# Patient Record
Sex: Female | Born: 1940 | Race: White | Hispanic: No | Smoking: Never smoker
Health system: Southern US, Community
[De-identification: ages and names within clinical notes are randomized; demographics above are authoritative.]

## PROBLEM LIST (undated history)

## (undated) ENCOUNTER — Ambulatory Visit: Payer: Medicare PPO

## (undated) ENCOUNTER — Ambulatory Visit: Admission: EM | Payer: Medicare PPO | Source: Home / Self Care

## (undated) DIAGNOSIS — E119 Type 2 diabetes mellitus without complications: Secondary | ICD-10-CM

## (undated) DIAGNOSIS — I10 Essential (primary) hypertension: Secondary | ICD-10-CM

## (undated) DIAGNOSIS — N289 Disorder of kidney and ureter, unspecified: Secondary | ICD-10-CM

## (undated) DIAGNOSIS — N189 Chronic kidney disease, unspecified: Secondary | ICD-10-CM

## (undated) HISTORY — PX: CARDIAC SURGERY: SHX584

---

## 2020-08-28 ENCOUNTER — Observation Stay (HOSPITAL_COMMUNITY): Payer: Medicare PPO

## 2020-08-28 ENCOUNTER — Encounter (HOSPITAL_COMMUNITY): Payer: Self-pay | Admitting: Obstetrics and Gynecology

## 2020-08-28 ENCOUNTER — Emergency Department (HOSPITAL_COMMUNITY): Payer: Medicare PPO

## 2020-08-28 ENCOUNTER — Inpatient Hospital Stay (HOSPITAL_COMMUNITY)
Admission: EM | Admit: 2020-08-28 | Discharge: 2020-09-01 | DRG: 291 | Disposition: A | Discharge status: Home Health | Payer: Medicare PPO | Attending: Internal Medicine | Admitting: Internal Medicine

## 2020-08-28 ENCOUNTER — Other Ambulatory Visit: Payer: Self-pay | Admitting: Internal Medicine

## 2020-08-28 ENCOUNTER — Other Ambulatory Visit: Payer: Self-pay

## 2020-08-28 DIAGNOSIS — R54 Age-related physical debility: Secondary | ICD-10-CM | POA: Diagnosis present

## 2020-08-28 DIAGNOSIS — Z951 Presence of aortocoronary bypass graft: Secondary | ICD-10-CM

## 2020-08-28 DIAGNOSIS — R0603 Acute respiratory distress: Secondary | ICD-10-CM | POA: Diagnosis present

## 2020-08-28 DIAGNOSIS — I509 Heart failure, unspecified: Secondary | ICD-10-CM

## 2020-08-28 DIAGNOSIS — Z79899 Other long term (current) drug therapy: Secondary | ICD-10-CM

## 2020-08-28 DIAGNOSIS — Z9114 Patient's other noncompliance with medication regimen: Secondary | ICD-10-CM

## 2020-08-28 DIAGNOSIS — Z833 Family history of diabetes mellitus: Secondary | ICD-10-CM

## 2020-08-28 DIAGNOSIS — K449 Diaphragmatic hernia without obstruction or gangrene: Secondary | ICD-10-CM | POA: Diagnosis present

## 2020-08-28 DIAGNOSIS — R251 Tremor, unspecified: Secondary | ICD-10-CM | POA: Diagnosis present

## 2020-08-28 DIAGNOSIS — Z20822 Contact with and (suspected) exposure to covid-19: Secondary | ICD-10-CM | POA: Diagnosis present

## 2020-08-28 DIAGNOSIS — I5043 Acute on chronic combined systolic (congestive) and diastolic (congestive) heart failure: Secondary | ICD-10-CM | POA: Diagnosis not present

## 2020-08-28 DIAGNOSIS — N1832 Chronic kidney disease, stage 3b: Secondary | ICD-10-CM | POA: Diagnosis present

## 2020-08-28 DIAGNOSIS — R1314 Dysphagia, pharyngoesophageal phase: Secondary | ICD-10-CM | POA: Diagnosis present

## 2020-08-28 DIAGNOSIS — I255 Ischemic cardiomyopathy: Secondary | ICD-10-CM | POA: Diagnosis present

## 2020-08-28 DIAGNOSIS — E785 Hyperlipidemia, unspecified: Secondary | ICD-10-CM | POA: Diagnosis present

## 2020-08-28 DIAGNOSIS — I420 Dilated cardiomyopathy: Secondary | ICD-10-CM | POA: Diagnosis present

## 2020-08-28 DIAGNOSIS — E039 Hypothyroidism, unspecified: Secondary | ICD-10-CM | POA: Diagnosis present

## 2020-08-28 DIAGNOSIS — I313 Pericardial effusion (noninflammatory): Secondary | ICD-10-CM | POA: Diagnosis present

## 2020-08-28 DIAGNOSIS — Z794 Long term (current) use of insulin: Secondary | ICD-10-CM

## 2020-08-28 DIAGNOSIS — R1310 Dysphagia, unspecified: Secondary | ICD-10-CM

## 2020-08-28 DIAGNOSIS — Z79890 Hormone replacement therapy (postmenopausal): Secondary | ICD-10-CM

## 2020-08-28 DIAGNOSIS — Z7982 Long term (current) use of aspirin: Secondary | ICD-10-CM

## 2020-08-28 DIAGNOSIS — T17908A Unspecified foreign body in respiratory tract, part unspecified causing other injury, initial encounter: Secondary | ICD-10-CM

## 2020-08-28 DIAGNOSIS — I272 Pulmonary hypertension, unspecified: Secondary | ICD-10-CM | POA: Diagnosis present

## 2020-08-28 DIAGNOSIS — K219 Gastro-esophageal reflux disease without esophagitis: Secondary | ICD-10-CM | POA: Diagnosis present

## 2020-08-28 DIAGNOSIS — E11649 Type 2 diabetes mellitus with hypoglycemia without coma: Secondary | ICD-10-CM | POA: Diagnosis not present

## 2020-08-28 DIAGNOSIS — E1165 Type 2 diabetes mellitus with hyperglycemia: Secondary | ICD-10-CM | POA: Diagnosis present

## 2020-08-28 DIAGNOSIS — I130 Hypertensive heart and chronic kidney disease with heart failure and stage 1 through stage 4 chronic kidney disease, or unspecified chronic kidney disease: Principal | ICD-10-CM | POA: Diagnosis present

## 2020-08-28 DIAGNOSIS — I2510 Atherosclerotic heart disease of native coronary artery without angina pectoris: Secondary | ICD-10-CM | POA: Diagnosis present

## 2020-08-28 DIAGNOSIS — E1122 Type 2 diabetes mellitus with diabetic chronic kidney disease: Secondary | ICD-10-CM | POA: Diagnosis present

## 2020-08-28 DIAGNOSIS — R198 Other specified symptoms and signs involving the digestive system and abdomen: Secondary | ICD-10-CM

## 2020-08-28 DIAGNOSIS — R0989 Other specified symptoms and signs involving the circulatory and respiratory systems: Secondary | ICD-10-CM

## 2020-08-28 DIAGNOSIS — Z7902 Long term (current) use of antithrombotics/antiplatelets: Secondary | ICD-10-CM

## 2020-08-28 DIAGNOSIS — Z9119 Patient's noncompliance with other medical treatment and regimen: Secondary | ICD-10-CM

## 2020-08-28 HISTORY — DX: Chronic kidney disease, unspecified: N18.9

## 2020-08-28 HISTORY — DX: Type 2 diabetes mellitus without complications: E11.9

## 2020-08-28 HISTORY — DX: Disorder of kidney and ureter, unspecified: N28.9

## 2020-08-28 HISTORY — DX: Essential (primary) hypertension: I10

## 2020-08-28 LAB — COMPREHENSIVE METABOLIC PANEL
ALT: 21 U/L (ref 0–44)
AST: 29 U/L (ref 15–41)
Albumin: 3.5 g/dL (ref 3.5–5.0)
Alkaline Phosphatase: 44 U/L (ref 38–126)
Anion gap: 12 (ref 5–15)
BUN: 55 mg/dL — ABNORMAL HIGH (ref 8–23)
CO2: 26 mmol/L (ref 22–32)
Calcium: 9.1 mg/dL (ref 8.9–10.3)
Chloride: 106 mmol/L (ref 98–111)
Creatinine, Ser: 1.5 mg/dL — ABNORMAL HIGH (ref 0.44–1.00)
GFR, Estimated: 35 mL/min — ABNORMAL LOW (ref >60)
Glucose, Bld: 167 mg/dL — ABNORMAL HIGH (ref 70–99)
Potassium: 4 mmol/L (ref 3.5–5.1)
Sodium: 144 mmol/L (ref 135–145)
Total Bilirubin: 0.6 mg/dL (ref 0.3–1.2)
Total Protein: 6.2 g/dL — ABNORMAL LOW (ref 6.5–8.1)

## 2020-08-28 LAB — CBC
HCT: 40.4 % (ref 36.0–46.0)
Hemoglobin: 12.9 g/dL (ref 12.0–15.0)
MCH: 31.5 pg (ref 26.0–34.0)
MCHC: 31.9 g/dL (ref 30.0–36.0)
MCV: 98.5 fL (ref 80.0–100.0)
Platelets: 140 10*3/uL — ABNORMAL LOW (ref 150–400)
RBC: 4.1 MIL/uL (ref 3.87–5.11)
RDW: 14 % (ref 11.5–15.5)
WBC: 9 10*3/uL (ref 4.0–10.5)
nRBC: 0 % (ref 0.0–0.2)

## 2020-08-28 LAB — SARS CORONAVIRUS 2 (TAT 6-24 HRS): SARS Coronavirus 2: NEGATIVE

## 2020-08-28 LAB — CBG MONITORING, ED
Glucose-Capillary: 77 mg/dL (ref 70–99)
Glucose-Capillary: 95 mg/dL (ref 70–99)

## 2020-08-28 LAB — BRAIN NATRIURETIC PEPTIDE: B Natriuretic Peptide: 1169 pg/mL — ABNORMAL HIGH (ref 0.0–100.0)

## 2020-08-28 MED ORDER — ACETAMINOPHEN 325 MG PO TABS: 650.0000 mg | ORAL_TABLET | ORAL | Status: DC | PRN

## 2020-08-28 MED ORDER — FUROSEMIDE 10 MG/ML IJ SOLN
40.0000 mg | Freq: Every day | INTRAMUSCULAR | Status: DC
  Administered 2020-08-29 – 2020-08-30 (×2): 40 mg via INTRAVENOUS
  Filled 2020-08-28 (×2): qty 4

## 2020-08-28 MED ORDER — METOPROLOL SUCCINATE ER 50 MG PO TB24
50.0000 mg | ORAL_TABLET | Freq: Every evening | ORAL | Status: DC
  Administered 2020-08-29 – 2020-08-31 (×3): 50 mg via ORAL
  Filled 2020-08-28 (×4): qty 1

## 2020-08-28 MED ORDER — PANTOPRAZOLE SODIUM 40 MG PO TBEC
40.0000 mg | DELAYED_RELEASE_TABLET | Freq: Every evening | ORAL | Status: DC
  Administered 2020-08-29: 19:00:00 40 mg via ORAL
  Filled 2020-08-28 (×3): qty 1

## 2020-08-28 MED ORDER — LEVOTHYROXINE SODIUM 88 MCG PO TABS
88.0000 ug | ORAL_TABLET | Freq: Every day | ORAL | Status: DC
  Administered 2020-08-29 – 2020-09-01 (×3): 88 ug via ORAL
  Filled 2020-08-28 (×3): qty 1

## 2020-08-28 MED ORDER — INSULIN ASPART 100 UNIT/ML ~~LOC~~ SOLN
0.0000 [IU] | SUBCUTANEOUS | Status: DC
  Administered 2020-08-29: 21:00:00 3 [IU] via SUBCUTANEOUS
  Administered 2020-08-29: 17:00:00 2 [IU] via SUBCUTANEOUS
  Administered 2020-08-30: 21:00:00 5 [IU] via SUBCUTANEOUS
  Administered 2020-08-31 (×3): 2 [IU] via SUBCUTANEOUS
  Administered 2020-08-31: 21:00:00 3 [IU] via SUBCUTANEOUS
  Administered 2020-09-01: 1 [IU] via SUBCUTANEOUS
  Administered 2020-09-01: 2 [IU] via SUBCUTANEOUS
  Filled 2020-08-28: qty 0.09

## 2020-08-28 MED ORDER — FUROSEMIDE 10 MG/ML IJ SOLN
40.0000 mg | Freq: Once | INTRAMUSCULAR | Status: AC
  Administered 2020-08-28: 16:00:00 40 mg via INTRAVENOUS
  Filled 2020-08-28: qty 4

## 2020-08-28 MED ORDER — CLOPIDOGREL BISULFATE 75 MG PO TABS
75.0000 mg | ORAL_TABLET | Freq: Every day | ORAL | Status: DC
  Administered 2020-08-29: 11:00:00 75 mg via ORAL
  Filled 2020-08-28 (×2): qty 1

## 2020-08-28 MED ORDER — INSULIN GLARGINE 100 UNIT/ML SOLOSTAR PEN
20.0000 [IU] | PEN_INJECTOR | Freq: Every morning | SUBCUTANEOUS | Status: DC
Start: 2020-08-29 — End: 2020-08-28

## 2020-08-28 MED ORDER — GABAPENTIN 100 MG PO CAPS: 100.0000 mg | ORAL_CAPSULE | Freq: Two times a day (BID) | ORAL | Status: DC

## 2020-08-28 MED ORDER — ATORVASTATIN CALCIUM 20 MG PO TABS
20.0000 mg | ORAL_TABLET | Freq: Every day | ORAL | Status: DC
  Administered 2020-08-28 – 2020-08-31 (×4): 20 mg via ORAL
  Filled 2020-08-28 (×3): qty 1
  Filled 2020-08-28: qty 2

## 2020-08-28 MED ORDER — ASPIRIN EC 81 MG PO TBEC
81.0000 mg | DELAYED_RELEASE_TABLET | Freq: Every day | ORAL | Status: DC
  Administered 2020-08-29 – 2020-09-01 (×4): 81 mg via ORAL
  Filled 2020-08-28 (×4): qty 1

## 2020-08-28 MED ORDER — INSULIN ASPART 100 UNIT/ML ~~LOC~~ SOLN
0.0000 [IU] | Freq: Every day | SUBCUTANEOUS | Status: DC
  Filled 2020-08-28: qty 0.05

## 2020-08-28 MED ORDER — HEPARIN SODIUM (PORCINE) 5000 UNIT/ML IJ SOLN
5000.0000 [IU] | Freq: Three times a day (TID) | INTRAMUSCULAR | Status: DC
  Administered 2020-08-28 – 2020-09-01 (×10): 5000 [IU] via SUBCUTANEOUS
  Filled 2020-08-28 (×10): qty 1

## 2020-08-28 MED ORDER — ONDANSETRON HCL 4 MG/2ML IJ SOLN: 4.0000 mg | Freq: Four times a day (QID) | INTRAMUSCULAR | Status: DC | PRN

## 2020-08-28 MED ORDER — SODIUM CHLORIDE 0.9% FLUSH
3.0000 mL | Freq: Two times a day (BID) | INTRAVENOUS | Status: DC
  Administered 2020-08-28: 23:00:00 3 mL via INTRAVENOUS

## 2020-08-28 MED ORDER — SODIUM CHLORIDE 0.9 % IV SOLN: 250.0000 mL | INTRAVENOUS | Status: DC | PRN

## 2020-08-28 MED ORDER — SODIUM CHLORIDE 0.9% FLUSH
3.0000 mL | INTRAVENOUS | Status: DC | PRN
Start: 2020-08-28 — End: 2020-09-01

## 2020-08-28 MED ORDER — INSULIN ASPART 100 UNIT/ML ~~LOC~~ SOLN
0.0000 [IU] | Freq: Three times a day (TID) | SUBCUTANEOUS | Status: DC
  Filled 2020-08-28: qty 0.09

## 2020-08-28 NOTE — ED Triage Notes (Signed)
Patient reports to the ER for Swelling in the legs. Patient has a hx of congestive heart failure. Patient has been taking 40 mg of lasix daily. Patient has a hx of CKD as well.  Patient reportedly has poorly controlled diabetes.

## 2020-08-28 NOTE — ED Provider Notes (Signed)
[facility]-EMERGENCY DEPT Provider Note   CSN: [id number] Arrival date & time: 08/28/20  1122     History Chief Complaint  Patient presents with  . Leg Swelling  . Altered Mental Status    Shelia Ward is a 79 y.o. female.  The history is provided by the patient, a relative and medical records.  Altered Mental Status  Shelia Ward is a 79 y.o. female who presents to the Emergency Department complaining of leg swelling. She presents the emergency department accompanied by her daughter for evaluation of one month of progressive bilateral lower extremity edema. She does have a history of CHF. She is on Lasix 40 daily. She saw her PCP earlier this month and her dose was temporarily increased to 40 BID for one week. She reports no significant diuresis after this increase. She lives at home alone and is currently about 3 1/2 hours away from home. Her family brought her to the area for the holidays. Patient is currently unable to walk without significant dyspnea on exertion. She was last hospitalized in March 2021. She is supposed to be on home oxygen but sent her supplies home does not currently have any more supplies.  Denies fevers, chest pain, v/d.  Has cough.     records reviewed through patients by chart, provided by the patient's daughter. In December 2021 she had an NT Pro BNP 6971. CMP with BUN 35, creatinine 1.32. On August 2021 she had a BUN of 41 and a creatinine of 1.41. In March 2021 she had an EF of 25 to 30%. On her outpatient office visit she had a documented weight of 134 pounds.  Past Medical History:  Diagnosis Date  . CKD (chronic kidney disease)   . Diabetes mellitus without complication (HCC)   . Hypertension   . Renal disorder     Patient Active Problem List   Diagnosis Date Noted  . Acute on chronic combined systolic (congestive) and diastolic (congestive) heart failure (HCC) 08/28/2020    Past Surgical History:  Procedure Laterality  Date  . CARDIAC SURGERY       OB History   No obstetric history on file.     No family history on file.  Social History   Tobacco Use  . Smoking status: Never Smoker  Substance Use Topics  . Alcohol use: Not Currently  . Drug use: Not Currently    Home Medications Prior to Admission medications   Medication Sig Start Date End Date Taking? Authorizing Provider  aspirin EC 81 MG tablet Take 81 mg by mouth daily. Swallow whole.   Yes [provider]  atorvastatin (LIPITOR) 20 MG tablet Take 20 mg by mouth at bedtime. 06/24/20  Yes [provider]  clopidogrel (PLAVIX) 75 MG tablet Take 75 mg by mouth daily. 08/24/20  Yes [provider]  furosemide (LASIX) 40 MG tablet Take 40 mg by mouth daily. 08/10/20  Yes [provider]  gabapentin (NEURONTIN) 100 MG capsule Take 100 mg by mouth 2 (two) times daily. 07/28/20  Yes [provider]  insulin glargine (LANTUS SOLOSTAR) 100 UNIT/ML Solostar Pen Inject 36 Units into the skin in the morning.   Yes [provider]  levothyroxine (SYNTHROID) 88 MCG tablet Take 88 mcg by mouth daily. 07/24/20  Yes [provider]  losartan (COZAAR) 25 MG tablet Take 25 mg by mouth 2 (two) times daily. 06/30/20  Yes [provider]  metoprolol succinate (TOPROL-XL) 50 MG 24 hr tablet Take 50  mg by mouth every evening. 06/24/20  Yes [provider]  pantoprazole (PROTONIX) 40 MG tablet Take 40 mg by mouth every evening. 07/28/20  Yes [provider]  calcium carbonate (TUMS - DOSED IN MG ELEMENTAL CALCIUM) 500 MG chewable tablet Chew 1 tablet by mouth 2 (two) times daily as needed for indigestion or heartburn.    [provider]    Allergies    Patient has no known allergies.  Review of Systems   Review of Systems  All other systems reviewed and are negative.   Physical Exam Updated Vital Signs BP (!) 153/70   Pulse 67   Temp 97.7 F (36.5 C) (Oral)    Resp (!) 24   Wt 63 kg   SpO2 98%   Physical Exam Vitals and nursing note reviewed.  Constitutional:      Appearance: She is well-developed and well-nourished.  HENT:     Head: Normocephalic and atraumatic.  Cardiovascular:     Rate and Rhythm: Normal rate and regular rhythm.     Heart sounds: No murmur heard.   Pulmonary:     Effort: Pulmonary effort is normal. No respiratory distress.     Comments: Occasional crackles in the lung bases bilaterally Abdominal:     Palpations: Abdomen is soft.     Tenderness: There is no abdominal tenderness. There is no guarding or rebound.  Musculoskeletal:        General: No tenderness or edema.     Comments: 2+-3+ pitting edema to BLE  Skin:    General: Skin is warm and dry.  Neurological:     Mental Status: She is alert and oriented to person, place, and time.  Psychiatric:        Mood and Affect: Mood and affect normal.        Behavior: Behavior normal.     ED Results / Procedures / Treatments   Labs (all labs ordered are listed, but only abnormal results are displayed) Labs Reviewed  COMPREHENSIVE METABOLIC PANEL - Abnormal; Notable for the following components:      Result Value   Glucose, Bld 167 (*)    BUN 55 (*)    Creatinine, Ser 1.50 (*)    Total Protein 6.2 (*)    GFR, Estimated 35 (*)    All other components within normal limits  CBC - Abnormal; Notable for the following components:   Platelets 140 (*)    All other components within normal limits  BRAIN NATRIURETIC PEPTIDE - Abnormal; Notable for the following components:   B Natriuretic Peptide 1,169.0 (*)    All other components within normal limits  SARS CORONAVIRUS 2 (TAT 6-24 HRS)    EKG None  Radiology DG Chest 2 View  Result Date: 08/28/2020 CLINICAL DATA:  CHF. EXAM: CHEST - 2 VIEW COMPARISON:  None FINDINGS: Previous median sternotomy and CABG procedure. Aortic atherosclerotic calcifications. Cardiac enlargement. Bilateral pleural effusions and  pulmonary vascular congestion noted compatible with mild CHF. No airspace consolidation. The thoracic scoliosis is convex towards the left. IMPRESSION: 1. Cardiac enlargement and mild CHF. Aortic Atherosclerosis (ICD10-I70.0). Electronically Signed   By: Signa Kell M.D.   On: 08/28/2020 12:32    Procedures Procedures (including critical care time)  Medications Ordered in ED Medications  furosemide (LASIX) injection 40 mg (has no administration in time range)    ED Course  I have reviewed the triage vital signs and the nursing notes.  Pertinent labs & imaging results that were available  during my care of the patient were reviewed by me and considered in my medical decision making (see chart for details).    MDM Rules/Calculators/A&P                         patient with history of CHF presents the emergency department accompanied by her daughter for evaluation of progressive lower extremity edema as well as dyspnea on exertion. She is chronically ill appearing on evaluation and in no acute distress. Chest x-ray with cardiac enlargement and pulmonary vascular congestion consistent with CHF. BNP is elevated. BMP with renal insufficiency, slightly worse when compared to priors in care everywhere. Inpatient symptoms will treat with Lasix and admit for ongoing treatment for CHF exacerbation. Hospitalist consulted for admission.  Final Clinical Impression(s) / ED Diagnoses Final diagnoses:  None    Rx / DC Orders ED Discharge Orders    None       Tilden Fossa, MD 08/28/20 1555

## 2020-08-28 NOTE — ED Notes (Signed)
Pt spo2 88-90%, placed on 2L [place]

## 2020-08-28 NOTE — ED Notes (Signed)
Pt given meal tray. Pt placed on purewick. Pt ambulated and o2 sat never dropped below 93.

## 2020-08-28 NOTE — H&P (Signed)
[facility] History and Physical   Patient: Shelia Ward OMB:[id number]   PCP: System, Provider Not In DOB: 18-May-1941   DOA: 08/28/2020   DOS: 08/28/2020   DOS: the patient was seen and examined on 08/28/2020  Patient coming from: The patient is coming from Home  Chief Complaint: Shortness of breath and leg swelling as well as right arm shaking  HPI: Shelia Ward is a 79 y.o. female with Past medical history of HTN, CKD 3B, type II DM, chronic systolic CHF, CAD, noncompliance. Patient presented with complaints of leg swelling and shortness of breath.  Patient has known history of heart failure.  Seen by PCP earlier in December and seem to have worsening swelling.  She was asked to take Lasix 40 twice daily for 1 week. Her normal baseline weight is 119 pounds and she is currently at 138 pounds. Started also noticing swelling in her legs. No nausea no vomiting.  No fever no chills.  No chest pain right now. No headache no dizziness Right now but reported some dizziness ongoing for last few days. Patient has also noticed increased shakiness of her right arm last night. Currently no focal deficit.  No tremors reported. Patient lives alone, and frequently misses her medication.  No fall reported but some near falls reported. Also denies any diarrhea. Does not use any oxygen at her baseline. Daughter is trying to bring the patient close to her urine but is gross of the care can be coordinated much easier. Patient has not seen her cardiologist in over a year.  Daughter would like to establish her care with cardiology.  ED Course: Presents with respiratory distress.  Chest x-ray shows CHF.  Started on IV Lasix.  Given reported history patient was referred for admission.  Review of Systems: as mentioned in the history of present illness.  All other systems reviewed and are negative.  Past Medical History:  Diagnosis Date  . CKD (chronic kidney disease)   . Diabetes mellitus without  complication (HCC)   . Hypertension   . Renal disorder    Past Surgical History:  Procedure Laterality Date  . CARDIAC SURGERY     Social History:  reports that she has never smoked. She does not have any smokeless tobacco history on file. She reports previous alcohol use. She reports previous drug use.  No Known Allergies  Family history reviewed and not pertinent Family History  Problem Relation Age of Onset  . Diabetes Mother   . Diabetes Father    Prior to Admission medications   Medication Sig Start Date End Date Taking? Authorizing Provider  aspirin EC 81 MG tablet Take 81 mg by mouth daily. Swallow whole.   Yes [provider]  atorvastatin (LIPITOR) 20 MG tablet Take 20 mg by mouth at bedtime. 06/24/20  Yes [provider]  clopidogrel (PLAVIX) 75 MG tablet Take 75 mg by mouth daily. 08/24/20  Yes [provider]  furosemide (LASIX) 40 MG tablet Take 40 mg by mouth daily. 08/10/20  Yes [provider]  gabapentin (NEURONTIN) 100 MG capsule Take 100 mg by mouth 2 (two) times daily. 07/28/20  Yes [provider]  insulin glargine (LANTUS SOLOSTAR) 100 UNIT/ML Solostar Pen Inject 36 Units into the skin in the morning.   Yes [provider]  levothyroxine (SYNTHROID) 88 MCG tablet Take 88 mcg by mouth daily. 07/24/20  Yes [provider]  losartan (COZAAR) 25 MG tablet Take 25 mg by mouth 2 (two) times daily. 06/30/20  Yes [provider]  metoprolol succinate (TOPROL-XL) 50 MG 24 hr tablet Take 50 mg by mouth every evening. 06/24/20  Yes [provider]  pantoprazole (PROTONIX) 40 MG tablet Take 40 mg by mouth every evening. 07/28/20  Yes [provider]  calcium carbonate (TUMS - DOSED IN MG ELEMENTAL CALCIUM) 500 MG chewable tablet Chew 1 tablet by mouth 2 (two) times daily as needed for indigestion or heartburn.    [provider]    Physical Exam: Vitals:   08/28/20 1545  08/28/20 1600 08/28/20 1615 08/28/20 1800  BP: (!) 140/57 (!) 158/82 (!) 176/70 139/67  Pulse: (!) 59 67 65 65  Resp: 19 (!) 21 (!) 23 20  Temp:      TempSrc:      SpO2: 97% 96% 96% 90%  Weight:        General: alert and oriented to time, place, and person. Appear in mild distress, affect appropriate Eyes: PERRL, Conjunctiva normal ENT: Oral Mucosa Clear, moist  Neck: positive JVD, no Abnormal Mass Or lumps Cardiovascular: S1 and S2 Present, aortic systolic  Murmur, peripheral pulses symmetrical Respiratory: increased respiratory effort, Bilateral Air entry equal and Decreased, no signs of accessory muscle use, bilateral  Crackles, no wheezes Abdomen: Bowel Sound present, Soft and no tenderness, no hernia Skin: no rashes  Extremities: bilateral  Pedal edema, no calf tenderness Neurologic: without any new focal findings Gait not checked due to patient safety concerns  Data Reviewed: I have personally reviewed and interpreted labs, imaging as discussed below.  CBC: Recent Labs  Lab 08/28/20 1212  WBC 9.0  HGB 12.9  HCT 40.4  MCV 98.5  PLT 140*   Basic Metabolic Panel: Recent Labs  Lab 08/28/20 1212  NA 144  K 4.0  CL 106  CO2 26  GLUCOSE 167*  BUN 55*  CREATININE 1.50*  CALCIUM 9.1   GFR: CrCl cannot be calculated (Unknown ideal weight.). Liver Function Tests: Recent Labs  Lab 08/28/20 1212  AST 29  ALT 21  ALKPHOS 44  BILITOT 0.6  PROT 6.2*  ALBUMIN 3.5   No results for input(s): LIPASE, AMYLASE in the last 168 hours. No results for input(s): AMMONIA in the last 168 hours. Coagulation Profile: No results for input(s): INR, PROTIME in the last 168 hours. Cardiac Enzymes: No results for input(s): CKTOTAL, CKMB, CKMBINDEX, TROPONINI in the last 168 hours. BNP (last 3 results) No results for input(s): PROBNP in the last 8760 hours. HbA1C: No results for input(s): HGBA1C in the last 72 hours. CBG: Recent Labs  Lab 08/28/20 1647  GLUCAP 77   Lipid  Profile: No results for input(s): CHOL, HDL, LDLCALC, TRIG, CHOLHDL, LDLDIRECT in the last 72 hours. Thyroid Function Tests: No results for input(s): TSH, T4TOTAL, FREET4, T3FREE, THYROIDAB in the last 72 hours. Anemia Panel: No results for input(s): VITAMINB12, FOLATE, FERRITIN, TIBC, IRON, RETICCTPCT in the last 72 hours. Urine analysis: No results found for: COLORURINE, APPEARANCEUR, LABSPEC, PHURINE, GLUCOSEU, HGBUR, BILIRUBINUR, KETONESUR, PROTEINUR, UROBILINOGEN, NITRITE, LEUKOCYTESUR  Radiological Exams on Admission: DG Chest 2 View  Result Date: 08/28/2020 CLINICAL DATA:  CHF. EXAM: CHEST - 2 VIEW COMPARISON:  None FINDINGS: Previous median sternotomy and CABG procedure. Aortic atherosclerotic calcifications. Cardiac enlargement. Bilateral pleural effusions and pulmonary vascular congestion noted compatible with mild CHF. No airspace consolidation. The thoracic scoliosis is convex towards the left. IMPRESSION: 1. Cardiac enlargement and mild CHF. Aortic Atherosclerosis (ICD10-I70.0). Electronically Signed   By: Signa Kell M.D.   On: 08/28/2020  12:32   Echocardiogram: EF 20 to 25%.  Repeat echocardiogram ordered.  I reviewed all nursing notes, pharmacy notes, vitals, pertinent old records.  Assessment/Plan 1.  Acute on chronic combined systolic and diastolic CHF Continue IV Lasix 40 mg daily. Monitor renal function. Also monitor electrolytes. Monitor ins and outs. Echocardiogram ordered. Continue home regimen.  2.  Dysphagia. Current treatment episode while in the ER. Currently remains n.p.o. Chest x-ray. Monitor on telemetry ventricular Speech therapy consulted.  3.  Essential hypertension Blood pressure stable. Continue home regimen for now.  4.  Chronic kidney disease stage IIIb Renal function mildly worsening from baseline although not meeting AKI criteria for now. We will continue to monitor while receiving IV diuresis.  5.  Type 2 diabetes mellitus,  uncontrolled with hyperglycemia with renal dysfunction and HLD. Currently n.p.o. therefore discontinuing long-acting insulin. Every 4 hours sliding scale.  6.  Tremors. Currently not Significant Could be associated with hypoxia although currently not hypoxic as well. Monitor.  7.  Hypothyroidism Continue Synthroid.  Nutrition: NPO DVT Prophylaxis: Subcutaneous Heparin   Advance goals of care discussion: Limited code BiPAP only. Palliative care Consulted, patient has history of noncompliance and poor cardiac reserve patient will benefit from outpatient palliative care discussion  Consults: Palliative care   Family Communication: family was present at bedside, at the time of interview.  Opportunity was given to ask question and all questions were answered satisfactorily.   Disposition:  From: Home Likely will need Home on discharge.   Author: Lynden Oxford, MD [facility] 08/28/2020 6:33 PM   To reach On-call, see care teams to locate the attending and reach out to them via [email or web address]. If 7PM-7AM, please contact night-coverage If you still have difficulty reaching the attending provider, please page the [facility] (Director on Call) for [facility] on amion for assistance.

## 2020-08-28 NOTE — ED Notes (Addendum)
This RN in to give PO meds, per pt and daughter, pt had choking episode while eating dinner and now feels it is hard to swallow. Spo2 now 90% RA, pt able to speak in full sentences. Expiratory wheezing noted. PO meds held at this time. Messaged MD.

## 2020-08-29 ENCOUNTER — Inpatient Hospital Stay (HOSPITAL_COMMUNITY): Payer: Medicare PPO

## 2020-08-29 ENCOUNTER — Encounter (HOSPITAL_COMMUNITY): Payer: Self-pay | Admitting: Internal Medicine

## 2020-08-29 ENCOUNTER — Observation Stay (HOSPITAL_COMMUNITY): Payer: Medicare PPO

## 2020-08-29 DIAGNOSIS — I5043 Acute on chronic combined systolic (congestive) and diastolic (congestive) heart failure: Secondary | ICD-10-CM

## 2020-08-29 DIAGNOSIS — I2510 Atherosclerotic heart disease of native coronary artery without angina pectoris: Secondary | ICD-10-CM | POA: Diagnosis present

## 2020-08-29 DIAGNOSIS — Z7902 Long term (current) use of antithrombotics/antiplatelets: Secondary | ICD-10-CM | POA: Diagnosis not present

## 2020-08-29 DIAGNOSIS — K449 Diaphragmatic hernia without obstruction or gangrene: Secondary | ICD-10-CM | POA: Diagnosis present

## 2020-08-29 DIAGNOSIS — R1314 Dysphagia, pharyngoesophageal phase: Secondary | ICD-10-CM | POA: Diagnosis present

## 2020-08-29 DIAGNOSIS — Z951 Presence of aortocoronary bypass graft: Secondary | ICD-10-CM | POA: Diagnosis not present

## 2020-08-29 DIAGNOSIS — E1122 Type 2 diabetes mellitus with diabetic chronic kidney disease: Secondary | ICD-10-CM | POA: Diagnosis present

## 2020-08-29 DIAGNOSIS — R0603 Acute respiratory distress: Secondary | ICD-10-CM | POA: Diagnosis present

## 2020-08-29 DIAGNOSIS — Z20822 Contact with and (suspected) exposure to covid-19: Secondary | ICD-10-CM | POA: Diagnosis present

## 2020-08-29 DIAGNOSIS — N1832 Chronic kidney disease, stage 3b: Secondary | ICD-10-CM | POA: Diagnosis present

## 2020-08-29 DIAGNOSIS — Z9114 Patient's other noncompliance with medication regimen: Secondary | ICD-10-CM | POA: Diagnosis not present

## 2020-08-29 DIAGNOSIS — E1165 Type 2 diabetes mellitus with hyperglycemia: Secondary | ICD-10-CM | POA: Diagnosis present

## 2020-08-29 DIAGNOSIS — E039 Hypothyroidism, unspecified: Secondary | ICD-10-CM | POA: Diagnosis present

## 2020-08-29 DIAGNOSIS — I130 Hypertensive heart and chronic kidney disease with heart failure and stage 1 through stage 4 chronic kidney disease, or unspecified chronic kidney disease: Secondary | ICD-10-CM | POA: Diagnosis present

## 2020-08-29 DIAGNOSIS — I272 Pulmonary hypertension, unspecified: Secondary | ICD-10-CM | POA: Diagnosis present

## 2020-08-29 DIAGNOSIS — I420 Dilated cardiomyopathy: Secondary | ICD-10-CM | POA: Diagnosis present

## 2020-08-29 DIAGNOSIS — E11649 Type 2 diabetes mellitus with hypoglycemia without coma: Secondary | ICD-10-CM | POA: Diagnosis not present

## 2020-08-29 DIAGNOSIS — Z7982 Long term (current) use of aspirin: Secondary | ICD-10-CM | POA: Diagnosis not present

## 2020-08-29 DIAGNOSIS — Z79890 Hormone replacement therapy (postmenopausal): Secondary | ICD-10-CM | POA: Diagnosis not present

## 2020-08-29 DIAGNOSIS — K219 Gastro-esophageal reflux disease without esophagitis: Secondary | ICD-10-CM | POA: Diagnosis present

## 2020-08-29 DIAGNOSIS — I313 Pericardial effusion (noninflammatory): Secondary | ICD-10-CM | POA: Diagnosis present

## 2020-08-29 DIAGNOSIS — E785 Hyperlipidemia, unspecified: Secondary | ICD-10-CM | POA: Diagnosis present

## 2020-08-29 DIAGNOSIS — I509 Heart failure, unspecified: Secondary | ICD-10-CM | POA: Diagnosis not present

## 2020-08-29 DIAGNOSIS — Z79899 Other long term (current) drug therapy: Secondary | ICD-10-CM | POA: Diagnosis not present

## 2020-08-29 DIAGNOSIS — I255 Ischemic cardiomyopathy: Secondary | ICD-10-CM | POA: Diagnosis present

## 2020-08-29 LAB — BASIC METABOLIC PANEL
Anion gap: 10 (ref 5–15)
BUN: 53 mg/dL — ABNORMAL HIGH (ref 8–23)
CO2: 26 mmol/L (ref 22–32)
Calcium: 8.8 mg/dL — ABNORMAL LOW (ref 8.9–10.3)
Chloride: 106 mmol/L (ref 98–111)
Creatinine, Ser: 1.47 mg/dL — ABNORMAL HIGH (ref 0.44–1.00)
GFR, Estimated: 36 mL/min — ABNORMAL LOW (ref >60)
Glucose, Bld: 106 mg/dL — ABNORMAL HIGH (ref 70–99)
Potassium: 4.8 mmol/L (ref 3.5–5.1)
Sodium: 142 mmol/L (ref 135–145)

## 2020-08-29 LAB — GLUCOSE, CAPILLARY
Glucose-Capillary: 74 mg/dL (ref 70–99)
Glucose-Capillary: 67 mg/dL — ABNORMAL LOW (ref 70–99)
Glucose-Capillary: 151 mg/dL — ABNORMAL HIGH (ref 70–99)
Glucose-Capillary: 151 mg/dL — ABNORMAL HIGH (ref 70–99)
Glucose-Capillary: 203 mg/dL — ABNORMAL HIGH (ref 70–99)

## 2020-08-29 LAB — HEMOGLOBIN A1C
Hgb A1c MFr Bld: 8.9 % — ABNORMAL HIGH (ref 4.8–5.6)
Mean Plasma Glucose: 208.73 mg/dL

## 2020-08-29 LAB — ECHOCARDIOGRAM COMPLETE
AR max vel: 1.62 cm2
AV Area VTI: 1.5 cm2
AV Area mean vel: 1.5 cm2
AV Mean grad: 7.6 mmHg
AV Peak grad: 14.5 mmHg
Ao pk vel: 1.9 m/s
Area-P 1/2: 4.06 cm2
Height: 61 in
S' Lateral: 4.8 cm
Weight: 2118.4 oz

## 2020-08-29 LAB — CBG MONITORING, ED
Glucose-Capillary: 148 mg/dL — ABNORMAL HIGH (ref 70–99)
Glucose-Capillary: 38 mg/dL — CL (ref 70–99)
Glucose-Capillary: 61 mg/dL — ABNORMAL LOW (ref 70–99)
Glucose-Capillary: 97 mg/dL (ref 70–99)

## 2020-08-29 MED ORDER — GLUCOSE 40 % PO GEL
1.0000 | Freq: Once | ORAL | Status: AC
  Administered 2020-08-29: 08:00:00 37.5 g via ORAL
  Filled 2020-08-29: qty 1

## 2020-08-29 MED ORDER — DEXTROSE 10 % IV SOLN: INTRAVENOUS | Status: DC

## 2020-08-29 MED ORDER — DEXTROSE 50 % IV SOLN
1.0000 | Freq: Once | INTRAVENOUS | Status: AC
  Administered 2020-08-29: 01:00:00 50 mL via INTRAVENOUS
  Filled 2020-08-29: qty 50

## 2020-08-29 MED ORDER — PERFLUTREN LIPID MICROSPHERE
1.0000 mL | INTRAVENOUS | Status: AC | PRN
  Administered 2020-08-29: 11:00:00 3 mL via INTRAVENOUS
  Filled 2020-08-29: qty 10

## 2020-08-29 MED ORDER — DEXTROSE 50 % IV SOLN
INTRAVENOUS | Status: AC
  Filled 2020-08-29: qty 50

## 2020-08-29 NOTE — Progress Notes (Signed)
PT Cancellation Note  Patient Details Name: Shelia Ward MRN: [id number] DOB: [birth year]   Cancelled Treatment:    Reason Eval/Treat Not Completed: Patient at procedure or test/unavailable Pt working with SLP earlier this morning and now off unit at procedure.  Will check back as schedule permits.   [name],[name] 08/29/2020, 1:59 PM [name] PT, DPT Acute Rehabilitation Services Pager: [phone] Office: [phone]

## 2020-08-29 NOTE — ED Notes (Signed)
Report given to Amber RN

## 2020-08-29 NOTE — ED Notes (Signed)
Provider P. Patel MD notified regarding pt's cbg of 54 and RN intervention.

## 2020-08-29 NOTE — Progress Notes (Signed)
[facility] Progress Note  Patient: Shelia Ward    XNA:[id number]  DOA: 08/28/2020     Date of Service: the patient was seen and examined on 08/29/2020  Brief hospital course: Shelia Ward is a 79 y.o. female with Past medical history of HTN, CKD 3B, type II DM, chronic systolic CHF, CAD, noncompliance. Patient presented with complaints of leg swelling and shortness of breath.  Found to have acute on chronic systolic CHF.  Also has dysphagia to solid food for which GI is consulted Currently plan is further dysphagia work-up.  Assessment and Plan: 1.  Acute on chronic combined systolic and diastolic CHF Treated with IV Lasix 40 mg daily. Given that the patient is now n.p.o. I will be holding the Lasix for tomorrow. Monitor renal function. Also monitor electrolytes. Monitor ins and outs. Echocardiogram shows EF of 20 to 25% with global hypokinesis.  Grade 2 diastolic dysfunction.  Moderate pericardial effusion without any cardiac tamponade with moderate to severe TR. Family would like to establish care with cardiology here we will consult cardiology given pericardial effusion. Continue home regimen. Holding Plavix for a potential endoscopy with intervention.  Family reports no recent CVA or CAD in last 1 year.  2.  Dysphagia. Esophageal narrowing 2 episodes of weakness dysphagia with aspiration once in ER and once on the floor both happened in the evening hours. Speech therapy consult should and the patient is able to tolerate p.o. diet. X-ray esophagus shows esophageal narrowing likely contributing to patient's dysphagia. [facility] consulted. Remains NPO. Unfortunately patient has received Plavix on 12/28 which will limit timing of intervention.  3.  Essential hypertension Blood pressure stable. Continue home regimen for now.  4.  Chronic kidney disease stage IIIb Renal function mildly worsening from baseline although not meeting AKI criteria for now. We will continue  to monitor while receiving IV diuresis.  5.  Type 2 diabetes mellitus, uncontrolled with hyperglycemia with renal dysfunction and HLD. Currently n.p.o. therefore discontinuing long-acting insulin. Every 4 hours sliding scale.  6.  Tremors. Currently not Significant Could be associated with hypoxia although currently not hypoxic as well. Monitor.  7.  Hypothyroidism Continue Synthroid.  Diet: N.p.o. DVT Prophylaxis:   heparin injection 5,000 Units Start: 08/28/20 1645    Advance goals of care discussion: Limited code  Family Communication: family was present at bedside, at the time of interview.  The pt provided permission to discuss medical plan with the family. Opportunity was given to ask question and all questions were answered satisfactorily.   Disposition:  Status is: Inpatient  Remains inpatient appropriate because:IV treatments appropriate due to intensity of illness or inability to take PO and Inpatient level of care appropriate due to severity of illness   Dispo: The patient is from: Home              Anticipated d/c is to: SNF              Anticipated d/c date is: > 3 days              Patient currently is not medically stable to d/c.  Subjective: No nausea no vomiting in the morning.  Breathing okay.  As the day progressed patient has having have difficulty swallowing primarily with solid food.  Physical Exam:  General: Appear in moderate distress, no Rash; Oral Mucosa Clear, moist. no Abnormal Neck Mass Or lumps, Conjunctiva normal  Cardiovascular: S1 and S2 Present, no Murmur, Respiratory: increased respiratory effort, Bilateral Air entry present  and bilateral Crackles, no wheezes Abdomen: Bowel Sound present, Soft and no tenderness Extremities: bilateral  Pedal edema, improving from admission Neurology: alert and oriented to place and person affect appropriate. no new focal deficit Gait not checked due to patient safety concerns  Vitals:   08/29/20  1120 08/29/20 1122 08/29/20 1254 08/29/20 1830  BP: (!) 147/84  (!) 148/77 (!) 142/77  Pulse: 70 71 74 80  Resp: 18  (!) 22   Temp:   99.3 F (37.4 C) 98.6 F (37 C)  TempSrc:   Oral Oral  SpO2:  (!) 88% 95% (!) 89%  Weight:      Height:        Intake/Output Summary (Last 24 hours) at 08/29/2020 1959 Last data filed at 08/29/2020 1910 Gross per 24 hour  Intake 254.62 ml  Output 600 ml  Net -345.38 ml   Filed Weights   08/28/20 1354 08/29/20 0037 08/29/20 0856  Weight: 63 kg 62.6 kg 60.1 kg    Data Reviewed: I have personally reviewed and interpreted daily labs, tele strips, imagings as discussed above. I reviewed all nursing notes, pharmacy notes, vitals, pertinent old records I have discussed plan of care as described above with RN and patient/family.  CBC: Recent Labs  Lab 08/28/20 1212  WBC 9.0  HGB 12.9  HCT 40.4  MCV 98.5  PLT 140*   Basic Metabolic Panel: Recent Labs  Lab 08/28/20 1212 08/29/20 0511  NA 144 142  K 4.0 4.8  CL 106 106  CO2 26 26  GLUCOSE 167* 106*  BUN 55* 53*  CREATININE 1.50* 1.47*  CALCIUM 9.1 8.8*    Studies: ECHOCARDIOGRAM COMPLETE  Result Date: 08/29/2020    ECHOCARDIOGRAM REPORT   Patient Name:   Shelia Ward Date of Exam: 08/29/2020 Medical Rec #:  [id number]     Height:       61.0 in Accession #:    [id number]    Weight:       132.4 lb Date of Birth:  16-Mar-1941      BSA:          1.585 m Patient Age:    79 years      BP:           144/54 mmHg Patient Gender: F             HR:           69 bpm. Exam Location:  Inpatient Procedure: 2D Echo and Intracardiac Opacification Agent Indications:    CHF  History:        Patient has no prior history of Echocardiogram examinations.                 Prior CABG; Risk Factors:Hypertension and Diabetes.  Sonographer:    Thurman Coyer RDCS (AE) Referring Phys: [id number] [facility] M Asencion Guisinger IMPRESSIONS  1. No left ventricular thrombus is seen with Definity contrast. Left ventricular ejection  fraction, by estimation, is 20 to 25%. The left ventricle has severely decreased function. The left ventricle demonstrates global hypokinesis. The left ventricular  internal cavity size was moderately dilated. Left ventricular diastolic parameters are consistent with Grade II diastolic dysfunction (pseudonormalization). Elevated left atrial pressure. There is mild dyskinesis of the left ventricular, entire apical segment.  2. Right ventricular systolic function is mildly reduced. The right ventricular size is normal. Mildly increased right ventricular wall thickness. There is moderately elevated pulmonary artery systolic pressure. The estimated right ventricular systolic pressure is 59.6 mmHg.  3. Left atrial size was mild to moderately dilated.  4. Moderate pericardial effusion. The pericardial effusion is circumferential. There is no evidence of cardiac tamponade.  5. The mitral valve is normal in structure. Mild mitral valve regurgitation. Moderate to severe mitral annular calcification.  6. Tricuspid valve regurgitation is moderate to severe.  7. The aortic valve is tricuspid. Aortic valve regurgitation is trivial. Mild to moderate aortic valve sclerosis/calcification is present, without any evidence of aortic stenosis.  8. The inferior vena cava is dilated in size with <50% respiratory variability, suggesting right atrial pressure of 15 mmHg. Comparison(s): Prior images unable to be directly viewed, comparison made by report only. Findings are similar to 11/09/2019 echo report in [facility]. FINDINGS  Left Ventricle: No left ventricular thrombus is seen with Definity contrast. Left ventricular ejection fraction, by estimation, is 20 to 25%. The left ventricle has severely decreased function. The left ventricle demonstrates global hypokinesis. Mild dyskinesis of the left ventricular, entire apical segment. Definity contrast agent was given IV to delineate the left ventricular endocardial borders. The left  ventricular internal cavity size was moderately dilated. There is no left ventricular hypertrophy. Abnormal (paradoxical) septal motion, consistent with left bundle branch block. Left ventricular diastolic parameters are consistent with Grade II diastolic dysfunction (pseudonormalization). Elevated left atrial pressure. Right Ventricle: The right ventricular size is normal. Mildly increased right ventricular wall thickness. Right ventricular systolic function is mildly reduced. There is moderately elevated pulmonary artery systolic pressure. The tricuspid regurgitant velocity is 3.34 m/s, and with an assumed right atrial pressure of 15 mmHg, the estimated right ventricular systolic pressure is 59.6 mmHg. Left Atrium: Left atrial size was mild to moderately dilated. Right Atrium: Right atrial size was normal in size. Pericardium: A moderately sized pericardial effusion is present. The pericardial effusion is circumferential. There is no evidence of cardiac tamponade. Mitral Valve: The mitral valve is normal in structure. Moderate to severe mitral annular calcification. Mild mitral valve regurgitation, with centrally-directed jet. MV peak gradient, 10.8 mmHg. The mean mitral valve gradient is 3.0 mmHg. Tricuspid Valve: The tricuspid valve is normal in structure. Tricuspid valve regurgitation is moderate to severe. Aortic Valve: The aortic valve is tricuspid. Aortic valve regurgitation is trivial. Mild to moderate aortic valve sclerosis/calcification is present, without any evidence of aortic stenosis. Aortic valve mean gradient measures 7.6 mmHg. Aortic valve peak  gradient measures 14.5 mmHg. Aortic valve area, by VTI measures 1.50 cm. Pulmonic Valve: The pulmonic valve was grossly normal. Pulmonic valve regurgitation is mild. Aorta: The aortic root is normal in size and structure. Venous: The inferior vena cava is dilated in size with less than 50% respiratory variability, suggesting right atrial pressure of 15  mmHg. IAS/Shunts: No atrial level shunt detected by color flow Doppler.  LEFT VENTRICLE PLAX 2D LVIDd:         6.40 cm LVIDs:         4.80 cm LV PW:         0.90 cm LV IVS:        0.80 cm LVOT diam:     2.00 cm LV SV:         65 LV SV Index:   41 LVOT Area:     3.14 cm  RIGHT VENTRICLE RV S prime:     7.25 cm/s TAPSE (M-mode): 1.1 cm LEFT ATRIUM             Index       RIGHT ATRIUM  Index LA diam:        4.10 cm 2.59 cm/m  RA Area:     18.60 cm LA Vol (A2C):   68.5 ml 43.22 ml/m RA Volume:   52.50 ml  33.12 ml/m LA Vol (A4C):   65.6 ml 41.39 ml/m LA Biplane Vol: 68.6 ml 43.28 ml/m  AORTIC VALVE AV Area (Vmax):    1.62 cm AV Area (Vmean):   1.50 cm AV Area (VTI):     1.50 cm AV Vmax:           190.25 cm/s AV Vmean:          129.393 cm/s AV VTI:            0.433 m AV Peak Grad:      14.5 mmHg AV Mean Grad:      7.6 mmHg LVOT Vmax:         98.40 cm/s LVOT Vmean:        61.900 cm/s LVOT VTI:          0.206 m LVOT/AV VTI ratio: 0.48  AORTA Ao Root diam: 2.60 cm MITRAL VALVE                TRICUSPID VALVE MV Area (PHT): 4.06 cm     TR Peak grad:   44.6 mmHg MV Peak grad:  10.8 mmHg    TR Vmax:        334.00 cm/s MV Mean grad:  3.0 mmHg MV Vmax:       1.64 m/s     SHUNTS MV Vmean:      80.6 cm/s    Systemic VTI:  0.21 m MV Decel Time: 187 msec     Systemic Diam: 2.00 cm MV E velocity: 157.00 cm/s MV A velocity: 110.00 cm/s MV E/A ratio:  1.43 Mihai Croitoru MD Electronically signed by Thurmon Fair MD Signature Date/Time: 08/29/2020/12:09:40 PM    Final    DG ESOPHAGUS W SINGLE CM (SOL OR THIN BA)  Result Date: 08/29/2020 CLINICAL DATA:  Dysphagia and globus sensation. EXAM: ESOPHOGRAM/BARIUM SWALLOW TECHNIQUE: Single contrast examination was performed using thin density barium. FLUOROSCOPY TIME:  Fluoroscopy Time:  2 minutes and 54 seconds Radiation Exposure Index (if provided by the fluoroscopic device): 36.4 mGy Number of Acquired Spot Images: 0 COMPARISON:  None. FINDINGS: Initial barium  swallows demonstrate normal pharyngeal motion with swallowing. No laryngeal penetration or aspiration. No upper esophageal webs, strictures or diverticuli. Esophageal dysmotility with disruption of the primary peristaltic wave, tertiary contractions, intermittent esophageal spasm and mild to moderate esophageal stasis. Small sliding-type hiatal hernia is noted. No GE reflux was demonstrated. No esophageal mass. There is mild smooth narrowing near the GE junction. The 13 mm barium pill lodged in this area but did eventually pass into the stomach. IMPRESSION: 1. Esophageal dysmotility. 2. Small sliding-type hiatal hernia but no demonstrable GE reflux. 3. Mild smooth narrowing near the GE junction. The 13 mm barium pill lodged in this area but did eventually pass. Electronically Signed   By: Rudie Meyer M.D.   On: 08/29/2020 14:40    Scheduled Meds: . aspirin EC  81 mg Oral Daily  . atorvastatin  20 mg Oral QHS  . furosemide  40 mg Intravenous Daily  . heparin  5,000 Units Subcutaneous Q8H  . insulin aspart  0-9 Units Subcutaneous Q4H  . levothyroxine  88 mcg Oral Q0600  . metoprolol succinate  50 mg Oral QPM  . pantoprazole  40 mg Oral QPM  .  sodium chloride flush  3 mL Intravenous Q12H   Continuous Infusions: . sodium chloride    . dextrose 30 mL/hr at 08/29/20 1910   PRN Meds: sodium chloride, acetaminophen, ondansetron (ZOFRAN) IV, sodium chloride flush  Time spent: 35 minutes  Author: Lynden OxfordPranav Monque Haggar, MD [facility] 08/29/2020 7:59 PM  To reach On-call, see care teams to locate the attending and reach out via [email or web address]. Between 7PM-7AM, please contact night-coverage If you still have difficulty reaching the attending provider, please page the [facility] (Director on Call) for [facility] on amion for assistance.

## 2020-08-29 NOTE — Progress Notes (Signed)
PT Cancellation Note  Patient Details Name: Shelia Ward MRN: [id number] DOB: [birth year]   Cancelled Treatment:    Reason Eval/Treat Not Completed: Fatigue/lethargy limiting ability to participate Pt back from procedure however reports being too fatigued to mobilize at this time and requests therapist check back tomorrow.   Vantasia Pinkney,[name] 08/29/2020, 3:15 PM Paulino Door, DPT Acute Rehabilitation Services Pager: [phone] Office: [phone]

## 2020-08-29 NOTE — Evaluation (Signed)
Occupational Therapy Evaluation Patient Details Name: Shelia Ward MRN: [id number] DOB: April 12, 1941 Today's Date: 08/29/2020    History of Present Illness Patient is an 79 year old female PMH of HTN, CKD 3B, type II DM, chronic systolic CHF, CAD, presenting to ED with LE swelling and SOB.   Clinical Impression   Patient is from out of town, lives approx 3.5 hrs away from her children here in [place] per DTR. Patient lives in two level home, can stay main level and is mod I for self care. Patient currently set up/supervision for UB ADL and min A for LB ADLs in standing for balance. Per patient's DTR patient has had falls at home, will sleep sitting forward on the couch and fall forward. DTR states while patient has been staying with her for the holidays patient fell onto floor from bed and didn't realize until DTR came to her room in the morning. DTR also states that patient is not thorough with hygiene at home, wearing soiled depends for too long "she has gotten UTI's." Patient's DTR expressed she and patient's son would like to pursue ALF for more consistent assistance/supervision as patient lives far from family. OT supports DTR's interest in this as it would be a safer D/C plan. Recommend continued acute OT services to maximize patient safety and independence with self care in order to facilitate D/C to venue listed below.    Follow Up Recommendations  Home health OT;Supervision/Assistance - 24 hour;Other (comment) (pt's DTR interested in ALF)    Equipment Recommendations  None recommended by OT       Precautions / Restrictions Precautions Precautions: Fall Precaution Comments: monitor O2, hx sliding OOB/ falling foward when asleep Restrictions Weight Bearing Restrictions: No      Mobility Bed Mobility Overal bed mobility: Needs Assistance Bed Mobility: Supine to Sit;Sit to Supine     Supine to sit: Supervision;HOB elevated Sit to supine: Supervision   General bed mobility  comments: increased time, no physical assistance    Transfers Overall transfer level: Needs assistance Equipment used: Rolling walker (2 wheeled) Transfers: Sit to/from Stand Sit to Stand: Min assist         General transfer comment: please see toilet transfer in ADL section    Balance Overall balance assessment: Needs assistance Sitting-balance support: Feet supported Sitting balance-Leahy Scale: Fair     Standing balance support: No upper extremity supported Standing balance-Leahy Scale: Fair Standing balance comment: patient able to wash hands at sink without UE support, min G for safety                           ADL either performed or assessed with clinical judgement   ADL Overall ADL's : Needs assistance/impaired     Grooming: Wash/dry hands;Min guard;Standing   Upper Body Bathing: Supervision/ safety;Sitting   Lower Body Bathing: Minimal assistance;Sit to/from stand   Upper Body Dressing : Supervision/safety;Sitting   Lower Body Dressing: Supervision/safety;Minimal assistance;Sitting/lateral leans;Sit to/from stand Lower Body Dressing Details (indicate cue type and reason): seated patient able to pull up socks without assistance, in standing patient min A for balance Toilet Transfer: Minimal assistance;Cueing for safety;Ambulation;Regular Toilet;RW Toilet Transfer Details (indicate cue type and reason): patient does run walker into objects in room and presents with mild unsteadiness therefore min A for safety with ambulation and transfers Toileting- Clothing Manipulation and Hygiene: Min guard;Sitting/lateral lean;Sit to/from stand       Functional mobility during ADLs: Minimal assistance;Cueing for safety;Rolling  walker General ADL Comments: patient's DTR reports at home patient does not always change her depends when she needs to "she's not always the best with hygiene stuff"                  Pertinent Vitals/Pain Pain Assessment: No/denies  pain     Hand Dominance Right   Extremity/Trunk Assessment Upper Extremity Assessment Upper Extremity Assessment: Generalized weakness   Lower Extremity Assessment Lower Extremity Assessment: Defer to PT evaluation       Communication Communication Communication: HOH   Cognition Arousal/Alertness: Awake/alert Behavior During Therapy: WFL for tasks assessed/performed Overall Cognitive Status: Difficult to assess                                 General Comments: when patient's DTR first arrive to room did not recognize DTR, however once hearing aid placed and prompted patient able to state "that's my DTR"   General Comments  patient at 92% on room air, notified RN that supplemental O2 left off            Home Living Family/patient expects to be discharged to:: Private residence Living Arrangements: Alone Available Help at Discharge: Family;Available PRN/intermittently Type of Home: House Home Access: Stairs to enter [employer] of Steps: 5 Entrance Stairs-Rails: Right Home Layout: Two level;Able to live on main level with bedroom/bathroom     Bathroom Shower/Tub: Tub/shower unit   Bathroom Toilet: Handicapped height     Home Equipment: Emergency planning/management officer - 2 wheels;Walker - 4 wheels;Cane - quad   Additional Comments: patient's family lives 3.5 hrs away      Prior Functioning/Environment Level of Independence: Independent with assistive device(s)        Comments: patient's DTR reports patient does not use her walker like she should, patient no longer drives.        OT Problem List: Impaired balance (sitting and/or standing);Decreased activity tolerance;Decreased safety awareness      OT Treatment/Interventions: Self-care/ADL training;DME and/or AE instruction;Therapeutic activities;Therapeutic exercise;Patient/family education;Balance training    OT Goals(Current goals can be found in the care plan section) Acute Rehab OT  Goals Patient Stated Goal: to eat OT Goal Formulation: With patient Time For Goal Achievement: 09/12/20 Potential to Achieve Goals: Good  OT Frequency: Min 2X/week   Barriers to D/C: Decreased caregiver support  per DTR patient lives 3.5 hrs away from her children          AM-PAC OT "6 Clicks" Daily Activity     Outcome Measure Help from another person eating meals?: None Help from another person taking care of personal grooming?: A Little Help from another person toileting, which includes using toliet, bedpan, or urinal?: A Little Help from another person bathing (including washing, rinsing, drying)?: A Little Help from another person to put on and taking off regular upper body clothing?: A Little Help from another person to put on and taking off regular lower body clothing?: A Little 6 Click Score: 19   End of Session Equipment Utilized During Treatment: Rolling walker Nurse Communication: Mobility status;Other (comment) (O2 sats)  Activity Tolerance: Patient tolerated treatment well Patient left: Other (comment);with bed alarm set;with call bell/phone within reach;with family/visitor present (seated EOB, told DTR not to leave if pt still EOB)  OT Visit Diagnosis: Unsteadiness on feet (R26.81);History of falling (Z91.81)                Time: 1062-6948 OT  Time Calculation (min): 32 min Charges:  OT General Charges $OT Visit: 1 Visit OT Evaluation $OT Eval Moderate Complexity: 1 Mod OT Treatments $Self Care/Home Management : 8-22 mins  Marlyce Huge OT OT pager: [phone]  Carmelia Roller 08/29/2020, 11:07 AM

## 2020-08-29 NOTE — Evaluation (Signed)
Clinical/Bedside Swallow Evaluation Patient Details  Name: Arvilla Salada MRN: [id number] Date of Birth: December 04, 1940  Today's Date: 08/29/2020 Time: SLP Start Time (ACUTE ONLY): 1140 SLP Stop Time (ACUTE ONLY): 1155 SLP Time Calculation (min) (ACUTE ONLY): 15 min  Past Medical History:  Past Medical History:  Diagnosis Date  . CKD (chronic kidney disease)   . Diabetes mellitus without complication (HCC)   . Hypertension   . Renal disorder    Past Surgical History:  Past Surgical History:  Procedure Laterality Date  . CARDIAC SURGERY     HPI:  Patient is an 79 year old female PMH of HTN, CKD 3B, type II DM, chronic systolic CHF, CAD, presenting to ED with LE swelling and SOB.   Assessment / Plan / Recommendation Clinical Impression  Patient presents with what appears to be a primary esophageal dysphagia. Oral and pharyngeal phase of swallow appeared [facility] and without any overt s/s of aspiration or penetration. Towards end of this assessment, patient started to belch and stated "I burp a lot when eating". She also endorses some globus sensation which according to daughter, was significant enough this morning that patient did not want her medications. Per chart review, patient is currently on 40mg  Protonix and daughter reports she does use Tums sometimes as well. SLP Visit Diagnosis: Dysphagia, unspecified (R13.10)    Aspiration Risk  Mild aspiration risk    Diet Recommendation Thin liquid;Regular   Liquid Administration via: Cup;Straw Medication Administration: Whole meds with liquid Supervision: Patient able to self feed Compensations: Minimize environmental distractions;Slow rate;Small sips/bites Postural Changes: Seated upright at 90 degrees;Remain upright for at least 30 minutes after po intake    Other  Recommendations Recommended Consults: Other (Comment) (may need increase dosage of PPI versus GI consult) Oral Care Recommendations: Oral care BID   Follow up Recommendations  None      Frequency and Duration min 1 x/week  1 week       Prognosis Prognosis for Safe Diet Advancement: Good      Swallow Study   General Date of Onset: 08/28/20 HPI: Patient is an 79 year old female PMH of HTN, CKD 3B, type II DM, chronic systolic CHF, CAD, presenting to ED with LE swelling and SOB. Type of Study: Bedside Swallow Evaluation Previous Swallow Assessment: Daughter reported that a bedside swallow type test was done during admission at hospital but SLP could not find it Diet Prior to this Study: NPO Temperature Spikes Noted: No Respiratory Status: Room air History of Recent Intubation: No Behavior/Cognition: Alert;Cooperative;Pleasant mood Oral Cavity Assessment: Within Functional Limits Oral Care Completed by SLP: Recent completion by staff Oral Cavity - Dentition: Dentures, top;Edentulous;Other (Comment) (has never had bottom dentures) Vision: Functional for self-feeding Self-Feeding Abilities: Able to feed self Patient Positioning: Upright in bed Baseline Vocal Quality: Normal Volitional Cough: Strong Volitional Swallow: Able to elicit    Oral/Motor/Sensory Function Overall Oral Motor/Sensory Function: Within functional limits   Ice Chips     Thin Liquid Thin Liquid: Within functional limits Presentation: Straw;Self Fed    Nectar Thick     Honey Thick     Puree Puree: Within functional limits Presentation: Self Fed   Solid     Solid: Impaired Pharyngeal Phase Impairments: Other (comments) Other Comments: patient with globus sensation and some belching which she reports is normal for her      94, MA, CCC-SLP Speech Therapy

## 2020-08-29 NOTE — Progress Notes (Signed)
Patient had an episode of "choking" with meals. Daughter is at bedside. Upon entry pt was witnessed coughing after taking a bite of food. Provider updated. Will continue to monitor.

## 2020-08-29 NOTE — Progress Notes (Signed)
  Echocardiogram 2D Echocardiogram has been performed.  Shelia Ward 08/29/2020, 11:50 AM

## 2020-08-29 NOTE — ED Notes (Signed)
Pt was given apple juice.

## 2020-08-30 DIAGNOSIS — I313 Pericardial effusion (noninflammatory): Secondary | ICD-10-CM

## 2020-08-30 DIAGNOSIS — I509 Heart failure, unspecified: Secondary | ICD-10-CM

## 2020-08-30 DIAGNOSIS — Z9119 Patient's noncompliance with other medical treatment and regimen: Secondary | ICD-10-CM

## 2020-08-30 DIAGNOSIS — Z951 Presence of aortocoronary bypass graft: Secondary | ICD-10-CM

## 2020-08-30 DIAGNOSIS — I255 Ischemic cardiomyopathy: Secondary | ICD-10-CM

## 2020-08-30 DIAGNOSIS — I2510 Atherosclerotic heart disease of native coronary artery without angina pectoris: Secondary | ICD-10-CM

## 2020-08-30 DIAGNOSIS — I5043 Acute on chronic combined systolic (congestive) and diastolic (congestive) heart failure: Secondary | ICD-10-CM | POA: Diagnosis not present

## 2020-08-30 LAB — CBC WITH DIFFERENTIAL/PLATELET
Abs Immature Granulocytes: 0.03 10*3/uL (ref 0.00–0.07)
Basophils Absolute: 0.1 10*3/uL (ref 0.0–0.1)
Basophils Relative: 1 %
Eosinophils Absolute: 0.2 10*3/uL (ref 0.0–0.5)
Eosinophils Relative: 2 %
HCT: 35.4 % — ABNORMAL LOW (ref 36.0–46.0)
Hemoglobin: 10.9 g/dL — ABNORMAL LOW (ref 12.0–15.0)
Immature Granulocytes: 0 %
Lymphocytes Relative: 16 %
Lymphs Abs: 1.5 10*3/uL (ref 0.7–4.0)
MCH: 31.3 pg (ref 26.0–34.0)
MCHC: 30.8 g/dL (ref 30.0–36.0)
MCV: 101.7 fL — ABNORMAL HIGH (ref 80.0–100.0)
Monocytes Absolute: 0.7 10*3/uL (ref 0.1–1.0)
Monocytes Relative: 8 %
Neutro Abs: 7 10*3/uL (ref 1.7–7.7)
Neutrophils Relative %: 73 %
Platelets: 104 10*3/uL — ABNORMAL LOW (ref 150–400)
RBC: 3.48 MIL/uL — ABNORMAL LOW (ref 3.87–5.11)
RDW: 14.2 % (ref 11.5–15.5)
WBC: 9.5 10*3/uL (ref 4.0–10.5)
nRBC: 0 % (ref 0.0–0.2)

## 2020-08-30 LAB — BASIC METABOLIC PANEL
Anion gap: 9 (ref 5–15)
BUN: 53 mg/dL — ABNORMAL HIGH (ref 8–23)
CO2: 27 mmol/L (ref 22–32)
Calcium: 8.5 mg/dL — ABNORMAL LOW (ref 8.9–10.3)
Chloride: 104 mmol/L (ref 98–111)
Creatinine, Ser: 1.53 mg/dL — ABNORMAL HIGH (ref 0.44–1.00)
GFR, Estimated: 34 mL/min — ABNORMAL LOW (ref >60)
Glucose, Bld: 143 mg/dL — ABNORMAL HIGH (ref 70–99)
Potassium: 3.8 mmol/L (ref 3.5–5.1)
Sodium: 140 mmol/L (ref 135–145)

## 2020-08-30 LAB — MAGNESIUM: Magnesium: 2.1 mg/dL (ref 1.7–2.4)

## 2020-08-30 LAB — SEDIMENTATION RATE: Sed Rate: 14 mm/hr (ref 0–22)

## 2020-08-30 LAB — GLUCOSE, CAPILLARY
Glucose-Capillary: 104 mg/dL — ABNORMAL HIGH (ref 70–99)
Glucose-Capillary: 120 mg/dL — ABNORMAL HIGH (ref 70–99)
Glucose-Capillary: 133 mg/dL — ABNORMAL HIGH (ref 70–99)
Glucose-Capillary: 172 mg/dL — ABNORMAL HIGH (ref 70–99)
Glucose-Capillary: 232 mg/dL — ABNORMAL HIGH (ref 70–99)
Glucose-Capillary: 269 mg/dL — ABNORMAL HIGH (ref 70–99)
Glucose-Capillary: 67 mg/dL — ABNORMAL LOW (ref 70–99)
Glucose-Capillary: 79 mg/dL (ref 70–99)

## 2020-08-30 LAB — C-REACTIVE PROTEIN: CRP: 5.2 mg/dL — ABNORMAL HIGH (ref <1.0)

## 2020-08-30 MED ORDER — FUROSEMIDE 10 MG/ML IJ SOLN
80.0000 mg | Freq: Two times a day (BID) | INTRAMUSCULAR | Status: DC
  Administered 2020-08-30: 15:00:00 80 mg via INTRAVENOUS
  Filled 2020-08-30: qty 8

## 2020-08-30 MED ORDER — DEXTROSE 50 % IV SOLN
INTRAVENOUS | Status: AC
  Administered 2020-08-30: 05:00:00 12.5 g via INTRAVENOUS
  Filled 2020-08-30: qty 50

## 2020-08-30 MED ORDER — PANTOPRAZOLE SODIUM 40 MG PO TBEC
40.0000 mg | DELAYED_RELEASE_TABLET | Freq: Two times a day (BID) | ORAL | Status: DC
Start: 2020-08-30 — End: 2020-09-01
  Administered 2020-08-30 – 2020-09-01 (×4): 40 mg via ORAL
  Filled 2020-08-30 (×3): qty 1

## 2020-08-30 MED ORDER — DEXTROSE 50 % IV SOLN: 12.5000 g | INTRAVENOUS | Status: AC

## 2020-08-30 NOTE — TOC Initial Note (Signed)
Transition of Care [facility]) - Initial/Assessment Note    Patient Details  Name: Shelia Ward MRN: [id number] Date of Birth: 02-11-1941  [facility]) CM/SW Contact:    Lanier Clam, RN Phone Number: 08/30/2020, 10:54 AM  Clinical Narrative: From home, lives in [place]. PT cons-await recc.                  Expected Discharge Plan: Skilled Nursing Facility Barriers to Discharge: Continued Medical Work up   Patient Goals and CMS Choice Patient states their goals for this hospitalization and ongoing recovery are:: may need rehab per dtr [facility].gov Compare Post Acute Care list provided to:: Patient Represenative (must comment) Jacki Cones [phone])    Expected Discharge Plan and Services Expected Discharge Plan: Skilled Nursing Facility   Discharge Planning Services: CM Consult Post Acute Care Choice: Skilled Nursing Facility Living arrangements for the past 2 months: Single Family Home                                      Prior Living Arrangements/Services Living arrangements for the past 2 months: Single Family Home Lives with:: Self Patient language and need for interpreter reviewed:: Yes Do you feel safe going back to the place where you live?: Yes      Need for Family Participation in Patient Care: No (Comment) Care giver support system in place?: Yes (comment) Current home services: DME (rw,cane, home 02-[facility]) Criminal Activity/Legal Involvement Pertinent to Current Situation/Hospitalization: No - Comment as needed  Activities of Daily Living Home Assistive Devices/Equipment: Hearing aid,Cane (specify quad or straight),Walker (specify type),Shower chair with back,Eyeglasses (right hearing aide) ADL Screening (condition at time of admission) Patient's cognitive ability adequate to safely complete daily activities?: Yes Is the patient deaf or have difficulty 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) Does the patient have  difficulty seeing, even when wearing glasses/contacts?: Yes Does the patient have difficulty concentrating, remembering, or making decisions?: No (at times) Patient able to express need for assistance with ADLs?: Yes Does the patient have difficulty dressing or bathing?: Yes Independently performs ADLs?: No Communication: Independent Dressing (OT): Needs assistance Is this a change from baseline?: Change from baseline, expected to last >3 days Grooming: Independent Feeding: Independent Bathing: Needs assistance Is this a change from baseline?: Change from baseline, expected to last >3 days Toileting: Needs assistance Is this a change from baseline?: Change from baseline, expected to last >3days In/Out Bed: Needs assistance Is this a change from baseline?: Change from baseline, expected to last >3 days Walks in Home: Needs assistance Is this a change from baseline?: Change from baseline, expected to last >3 days Does the patient have difficulty walking or climbing stairs?: Yes Weakness of Legs: Both Weakness of Arms/Hands: Both  Permission Sought/Granted Permission sought to share information with : Case Manager Permission granted to share information with : Yes, Verbal Permission Granted  Share Information with NAME: Case Manager     Permission granted to share info w Relationship: Jacki Cones dtr [phone]     Emotional Assessment Appearance:: Appears stated age Attitude/Demeanor/Rapport: Gracious Affect (typically observed): Accepting Orientation: : Oriented to Self,Oriented to Place,Oriented to  Time,Oriented to Situation Alcohol / Substance Use: Not Applicable Psych Involvement: No (comment)  Admission diagnosis:  Acute on chronic combined systolic (congestive) and diastolic (congestive) heart failure (HCC) [I50.43] Aspiration into airway [T17.908A] Acute congestive heart failure, unspecified  heart failure type [facility]) [I50.9] Patient Active Problem List   Diagnosis Date  Noted  . Acute on chronic combined systolic (congestive) and diastolic (congestive) heart failure (HCC) 08/28/2020   PCP:  System, Provider Not In Pharmacy:   [id number] [place], [place] - [address] AT [address] [place] [address] Phone: [phone] Fax: [phone]     Social Determinants of Health (SDOH) Interventions    Readmission Risk Interventions No flowsheet data found.

## 2020-08-30 NOTE — Evaluation (Addendum)
Physical Therapy Evaluation Patient Details Name: Shelia Ward MRN: [id number] DOB: 09-15-1940 Today's Date: 08/30/2020   History of Present Illness  Patient is an 79 year old female PMH of HTN, CKD 3B, type II DM, chronic systolic CHF, CAD, presenting to ED with LE swelling and SOB. Dx: acute on chronic CHF  Clinical Impression  On eval, pt required Min assist for mobility. She walked ~100 feet with a RW. O2 87% on RA during ambulation. Pt tolerated activity fairly well with some fatigue towards end of ambulation distance. Son was present during session. Discussed d/c plan-pt wants to return home. Mentioned ST rehab-son is concerned about COVID. Son stated that they are entertaining the thought of ALF placement at some point. Recommend HHPT and 24 hour supervision/assist. Will likely need to maximize home health services.     Follow Up Recommendations Home health PT;Supervision/Assistance - 24 hour    Equipment Recommendations  None recommended by PT    Recommendations for Other Services       Precautions / Restrictions Precautions Precautions: Fall Precaution Comments: monitor O2, hx sliding OOB/ falling foward when asleep Restrictions Weight Bearing Restrictions: No      Mobility  Bed Mobility               General bed mobility comments: sitting EOB at start of session    Transfers Overall transfer level: Needs assistance Equipment used: Rolling walker (2 wheeled) Transfers: Sit to/from Stand Sit to Stand: Min assist         General transfer comment: Assist to power up, stabilize. Cues for safety, hand placement.  Ambulation/Gait Ambulation/Gait assistance: Min assist Gait Distance (Feet): 100 Feet Assistive device: Rolling walker (2 wheeled) Gait Pattern/deviations: Step-through pattern;Decreased stride length     General Gait Details: Intermittent assist to steady and manage RW, but mostly min guard with RW use. O2 87% on RA. Pt fatigues fairly  easily.  Stairs            Wheelchair Mobility    Modified Rankin (Stroke Patients Only)       Balance Overall balance assessment: Needs assistance         Standing balance support: Bilateral upper extremity supported Standing balance-Leahy Scale: Fair                               Pertinent Vitals/Pain Pain Assessment: No/denies pain    Home Living Family/patient expects to be discharged to:: Private residence Living Arrangements: Alone Available Help at Discharge: Family;Available PRN/intermittently Type of Home: House Home Access: Stairs to enter Entrance Stairs-Rails: Right   Home Layout: Two level;Able to live on main level with bedroom/bathroom Home Equipment: Shower seat;Walker - 2 wheels;Walker - 4 wheels;Cane - quad Additional Comments: patient's family lives 3.5 hrs away    Prior Function Level of Independence: Independent with assistive device(s)         Comments: patient's DTR reports patient does not use her walker like she should-uses cane sometimes, patient no longer drives.     Hand Dominance   Dominant Hand: Right    Extremity/Trunk Assessment   Upper Extremity Assessment Upper Extremity Assessment: Defer to OT evaluation    Lower Extremity Assessment Lower Extremity Assessment: Generalized weakness    Cervical / Trunk Assessment Cervical / Trunk Assessment: Kyphotic  Communication   Communication: HOH  Cognition Arousal/Alertness: Awake/alert Behavior During Therapy: WFL for tasks assessed/performed Overall Cognitive Status: Within Functional Limits for tasks  assessed Area of Impairment: Safety/judgement                         Safety/Judgement: Decreased awareness of safety            General Comments      Exercises     Assessment/Plan    PT Assessment Patient needs continued PT services  PT Problem List Decreased strength;Decreased mobility;Decreased activity tolerance;Decreased  balance;Decreased safety awareness       PT Treatment Interventions DME instruction;Gait training;Therapeutic activities;Therapeutic exercise;Patient/family education;Balance training;Functional mobility training    PT Goals (Current goals can be found in the Care Plan section)  Acute Rehab PT Goals Patient Stated Goal: to go home PT Goal Formulation: With patient Time For Goal Achievement: 09/13/20 Potential to Achieve Goals: Good    Frequency Min 3X/week   Barriers to discharge Decreased caregiver support      Co-evaluation               AM-PAC PT "6 Clicks" Mobility  Outcome Measure Help needed turning from your back to your side while in a flat bed without using bedrails?: A Little Help needed moving from lying on your back to sitting on the side of a flat bed without using bedrails?: A Little Help needed moving to and from a bed to a chair (including a wheelchair)?: A Little Help needed standing up from a chair using your arms (e.g., wheelchair or bedside chair)?: A Little Help needed to walk in hospital room?: A Little Help needed climbing 3-5 steps with a railing? : A Little 6 Click Score: 18    End of Session Equipment Utilized During Treatment: Gait belt Activity Tolerance: Patient tolerated treatment well Patient left: in chair;with call bell/phone within reach;with family/visitor present;with chair alarm set   PT Visit Diagnosis: Unsteadiness on feet (R26.81);Muscle weakness (generalized) (M62.81);History of falling (Z91.81)    Time: 9242-6834 PT Time Calculation (min) (ACUTE ONLY): 22 min   Charges:   PT Evaluation $PT Eval Moderate Complexity: 1 Mod             Faye Ramsay, PT Acute Rehabilitation  Office: [phone] Pager: [phone]

## 2020-08-30 NOTE — Consult Note (Signed)
Referring Provider: [facility] Primary Care Physician:  System, Provider Not In Primary Gastroenterologist:  Unassigned  Reason for Consultation:  Dysphagia  HPI: Shelia Ward is a 79 y.o. female with past medical history of CHF (EF 20-25% as of echo 12/28), CAD (s/p CABG 2014), DM type II and CKD presenting for consultation of dysphagia.  Patient reports dysphagia to both solids and liquids occurring intermittently over the last several months.  Has symptoms of dysphagia daily to every other day.  She states she feels "choked" when eating, which causes her to cough.  She occasionally experiences nausea and regurgitation of food.  Also reports chronic GERD for which she takes Protonix 40 mg daily.  She initially presented to the ED with shortness of breath, which she states is improving.  She denies any abdominal pain, chest pain, changes in stool, melena, or hematochezia.  Denies changes in appetite, early satiety, or unexplained weight loss.  She has never had endoscopy or colonoscopy.  Denies known family history of colon cancer or gastrointestinal malignancy.  She is on Plavix, last dose 12/28 at 11 AM.  She also takes 81 mg aspirin daily.  Also reports taking aspirin or ibuprofen as needed for headaches.  Barium swallow 08/29/20: Esophageal dysmotility.  Small sliding-type hiatal hernia but no demonstrable GE reflux. Mild smooth narrowing near the GE junction. The 13 mm barium pill lodged in this area but did eventually pass.  Past Medical History:  Diagnosis Date  . CKD (chronic kidney disease)   . Diabetes mellitus without complication (HCC)   . Hypertension   . Renal disorder     Past Surgical History:  Procedure Laterality Date  . CARDIAC SURGERY      Prior to Admission medications   Medication Sig Start Date End Date Taking? Authorizing Provider  aspirin EC 81 MG tablet Take 81 mg by mouth daily. Swallow whole.   Yes [provider]  atorvastatin (LIPITOR) 20 MG  tablet Take 20 mg by mouth at bedtime. 06/24/20  Yes [provider]  clopidogrel (PLAVIX) 75 MG tablet Take 75 mg by mouth daily. 08/24/20  Yes [provider]  furosemide (LASIX) 40 MG tablet Take 40 mg by mouth daily. 08/10/20  Yes [provider]  gabapentin (NEURONTIN) 100 MG capsule Take 100 mg by mouth 2 (two) times daily. 07/28/20  Yes [provider]  insulin glargine (LANTUS SOLOSTAR) 100 UNIT/ML Solostar Pen Inject 36 Units into the skin in the morning.   Yes [provider]  levothyroxine (SYNTHROID) 88 MCG tablet Take 88 mcg by mouth daily. 07/24/20  Yes [provider]  losartan (COZAAR) 25 MG tablet Take 25 mg by mouth 2 (two) times daily. 06/30/20  Yes [provider]  metoprolol succinate (TOPROL-XL) 50 MG 24 hr tablet Take 50 mg by mouth every evening. 06/24/20  Yes [provider]  pantoprazole (PROTONIX) 40 MG tablet Take 40 mg by mouth every evening. 07/28/20  Yes [provider]  calcium carbonate (TUMS - DOSED IN MG ELEMENTAL CALCIUM) 500 MG chewable tablet Chew 1 tablet by mouth 2 (two) times daily as needed for indigestion or heartburn.    [provider]    Scheduled Meds: . aspirin EC  81 mg Oral Daily  . atorvastatin  20 mg Oral QHS  . furosemide  40 mg Intravenous Daily  . heparin  5,000 Units Subcutaneous Q8H  . insulin aspart  0-9 Units Subcutaneous Q4H  . levothyroxine  88 mcg Oral Q0600  .  metoprolol succinate  50 mg Oral QPM  . pantoprazole  40 mg Oral QPM  . sodium chloride flush  3 mL Intravenous Q12H   Continuous Infusions: . sodium chloride    . dextrose 40 mL/hr at 08/30/20 0543   PRN Meds:.sodium chloride, acetaminophen, ondansetron (ZOFRAN) IV, sodium chloride flush  Allergies as of 08/28/2020  . (No Known Allergies)    Family History  Problem Relation Age of Onset  . Diabetes Mother   . Diabetes Father     Social History   Socioeconomic History   . Marital status: Widowed    Spouse name: Not on file  . Number of children: Not on file  . Years of education: Not on file  . Highest education level: Not on file  Occupational History  . Not on file  Tobacco Use  . Smoking status: Never Smoker  . Smokeless tobacco: Not on file  Substance and Sexual Activity  . Alcohol use: Not Currently  . Drug use: Not Currently  . Sexual activity: Not Currently  Other Topics Concern  . Not on file  Social History Narrative  . Not on file   Social Determinants of Health   Financial Resource Strain: Not on file  Food Insecurity: Not on file  Transportation Needs: Not on file  Physical Activity: Not on file  Stress: Not on file  Social Connections: Not on file  Intimate Partner Violence: Not on file    Review of Systems: Review of Systems  Constitutional: Negative for chills and fever.  HENT: Negative for sinus pain and sore throat.   Eyes: Negative for pain and redness.  Respiratory: Positive for shortness of breath. Negative for cough.   Cardiovascular: Negative for chest pain and palpitations.  Gastrointestinal: Positive for heartburn and nausea. Negative for abdominal pain, blood in stool, constipation, diarrhea, melena and vomiting.  Genitourinary: Negative for flank pain and hematuria.  Musculoskeletal: Negative for falls and joint pain.  Skin: Negative for itching and rash.  Neurological: Negative for seizures and loss of consciousness.  Endo/Heme/Allergies: Negative for polydipsia. Does not bruise/bleed easily.  Psychiatric/Behavioral: Negative for substance abuse. The patient is not nervous/anxious.      Physical Exam: Vital signs: Vitals:   08/29/20 2021 08/30/20 0410  BP: (!) 152/79 (!) 151/72  Pulse: 75 68  Resp: 18 16  Temp: 98.8 F (37.1 C) 98.2 F (36.8 C)  SpO2: 98% 100%   Last BM Date: 08/28/20  Physical Exam Vitals reviewed.  Constitutional:      General: She is not in acute distress.     Interventions: Nasal cannula in place.  HENT:     Head: Normocephalic and atraumatic.     Nose: Nose normal. No congestion.     Mouth/Throat:     Mouth: Mucous membranes are moist.     Pharynx: Oropharynx is clear.  Eyes:     General: No scleral icterus.    Extraocular Movements: Extraocular movements intact.     Conjunctiva/sclera: Conjunctivae normal.  Cardiovascular:     Rate and Rhythm: Normal rate and regular rhythm.     Pulses: Normal pulses.     Heart sounds: Murmur heard.    Pulmonary:     Effort: Pulmonary effort is normal. No respiratory distress.     Breath sounds: Normal breath sounds.  Abdominal:     General: Bowel sounds are normal. There is no distension.     Palpations: Abdomen is soft. There is no mass.     Tenderness:  There is no abdominal tenderness. There is no guarding or rebound.     Hernia: A hernia (umbilical) is present.  Musculoskeletal:        General: No swelling or tenderness.     Cervical back: Normal range of motion and neck supple.  Skin:    General: Skin is warm and dry.  Neurological:     General: No focal deficit present.     Mental Status: She is alert and oriented to person, place, and time.  Psychiatric:        Mood and Affect: Mood normal.        Behavior: Behavior normal. Behavior is cooperative.      GI:  Lab Results: Recent Labs    08/28/20 1212 08/30/20 0453  WBC 9.0 9.5  HGB 12.9 10.9*  HCT 40.4 35.4*  PLT 140* 104*   BMET Recent Labs    08/28/20 1212 08/29/20 0511 08/30/20 0453  NA 144 142 140  K 4.0 4.8 3.8  CL 106 106 104  CO2 26 26 27   GLUCOSE 167* 106* 143*  BUN 55* 53* 53*  CREATININE 1.50* 1.47* 1.53*  CALCIUM 9.1 8.8* 8.5*   LFT Recent Labs    08/28/20 1212  PROT 6.2*  ALBUMIN 3.5  AST 29  ALT 21  ALKPHOS 44  BILITOT 0.6   PT/INR No results for input(s): LABPROT, INR in the last 72 hours.   Studies/Results: DG Chest 2 View  Result Date: 08/28/2020 CLINICAL DATA:  CHF. EXAM: CHEST -  2 VIEW COMPARISON:  None FINDINGS: Previous median sternotomy and CABG procedure. Aortic atherosclerotic calcifications. Cardiac enlargement. Bilateral pleural effusions and pulmonary vascular congestion noted compatible with mild CHF. No airspace consolidation. The thoracic scoliosis is convex towards the left. IMPRESSION: 1. Cardiac enlargement and mild CHF. Aortic Atherosclerosis (ICD10-I70.0). Electronically Signed   By: Signa Kellaylor  Stroud M.D.   On: 08/28/2020 12:32   DG CHEST PORT 1 VIEW  Result Date: 08/28/2020 CLINICAL DATA:  CHF, weakness, short of breath, choking episode earlier today EXAM: PORTABLE CHEST 1 VIEW COMPARISON:  08/28/2020 at 12:23 p.m. FINDINGS: Single frontal view of the chest demonstrates persistent enlargement the cardiac silhouette. Stable atherosclerosis of the aorta. Postsurgical changes from median sternotomy. Central vascular congestion and small bilateral pleural effusions are again noted. No focal consolidation or pneumothorax. IMPRESSION: 1. Stable findings of mild congestive heart failure. Electronically Signed   By: Sharlet SalinaMichael  Brown M.D.   On: 08/28/2020 18:55   ECHOCARDIOGRAM COMPLETE  Result Date: 08/29/2020    ECHOCARDIOGRAM REPORT   Patient Name:   Daralene MilchSANDRA Scarberry Date of Exam: 08/29/2020 Medical Rec #:  [id number]     Height:       61.0 in Accession #:    [id number]    Weight:       132.4 lb Date of Birth:  10/14/1940      BSA:          1.585 m Patient Age:    79 years      BP:           144/54 mmHg Patient Gender: F             HR:           69 bpm. Exam Location:  Inpatient Procedure: 2D Echo and Intracardiac Opacification Agent Indications:    CHF  History:        Patient has no prior history of Echocardiogram examinations.  Prior CABG; Risk Factors:Hypertension and Diabetes.  Sonographer:    Thurman Coyer RDCS (AE) Referring Phys: [id number] [facility] M PATEL IMPRESSIONS  1. No left ventricular thrombus is seen with Definity contrast. Left ventricular  ejection fraction, by estimation, is 20 to 25%. The left ventricle has severely decreased function. The left ventricle demonstrates global hypokinesis. The left ventricular  internal cavity size was moderately dilated. Left ventricular diastolic parameters are consistent with Grade II diastolic dysfunction (pseudonormalization). Elevated left atrial pressure. There is mild dyskinesis of the left ventricular, entire apical segment.  2. Right ventricular systolic function is mildly reduced. The right ventricular size is normal. Mildly increased right ventricular wall thickness. There is moderately elevated pulmonary artery systolic pressure. The estimated right ventricular systolic pressure is 59.6 mmHg.  3. Left atrial size was mild to moderately dilated.  4. Moderate pericardial effusion. The pericardial effusion is circumferential. There is no evidence of cardiac tamponade.  5. The mitral valve is normal in structure. Mild mitral valve regurgitation. Moderate to severe mitral annular calcification.  6. Tricuspid valve regurgitation is moderate to severe.  7. The aortic valve is tricuspid. Aortic valve regurgitation is trivial. Mild to moderate aortic valve sclerosis/calcification is present, without any evidence of aortic stenosis.  8. The inferior vena cava is dilated in size with <50% respiratory variability, suggesting right atrial pressure of 15 mmHg. Comparison(s): Prior images unable to be directly viewed, comparison made by report only. Findings are similar to 11/09/2019 echo report in [facility]. FINDINGS  Left Ventricle: No left ventricular thrombus is seen with Definity contrast. Left ventricular ejection fraction, by estimation, is 20 to 25%. The left ventricle has severely decreased function. The left ventricle demonstrates global hypokinesis. Mild dyskinesis of the left ventricular, entire apical segment. Definity contrast agent was given IV to delineate the left ventricular endocardial borders.  The left ventricular internal cavity size was moderately dilated. There is no left ventricular hypertrophy. Abnormal (paradoxical) septal motion, consistent with left bundle branch block. Left ventricular diastolic parameters are consistent with Grade II diastolic dysfunction (pseudonormalization). Elevated left atrial pressure. Right Ventricle: The right ventricular size is normal. Mildly increased right ventricular wall thickness. Right ventricular systolic function is mildly reduced. There is moderately elevated pulmonary artery systolic pressure. The tricuspid regurgitant velocity is 3.34 m/s, and with an assumed right atrial pressure of 15 mmHg, the estimated right ventricular systolic pressure is 59.6 mmHg. Left Atrium: Left atrial size was mild to moderately dilated. Right Atrium: Right atrial size was normal in size. Pericardium: A moderately sized pericardial effusion is present. The pericardial effusion is circumferential. There is no evidence of cardiac tamponade. Mitral Valve: The mitral valve is normal in structure. Moderate to severe mitral annular calcification. Mild mitral valve regurgitation, with centrally-directed jet. MV peak gradient, 10.8 mmHg. The mean mitral valve gradient is 3.0 mmHg. Tricuspid Valve: The tricuspid valve is normal in structure. Tricuspid valve regurgitation is moderate to severe. Aortic Valve: The aortic valve is tricuspid. Aortic valve regurgitation is trivial. Mild to moderate aortic valve sclerosis/calcification is present, without any evidence of aortic stenosis. Aortic valve mean gradient measures 7.6 mmHg. Aortic valve peak  gradient measures 14.5 mmHg. Aortic valve area, by VTI measures 1.50 cm. Pulmonic Valve: The pulmonic valve was grossly normal. Pulmonic valve regurgitation is mild. Aorta: The aortic root is normal in size and structure. Venous: The inferior vena cava is dilated in size with less than 50% respiratory variability, suggesting right atrial pressure  of 15 mmHg. IAS/Shunts: No atrial level  shunt detected by color flow Doppler.  LEFT VENTRICLE PLAX 2D LVIDd:         6.40 cm LVIDs:         4.80 cm LV PW:         0.90 cm LV IVS:        0.80 cm LVOT diam:     2.00 cm LV SV:         65 LV SV Index:   41 LVOT Area:     3.14 cm  RIGHT VENTRICLE RV S prime:     7.25 cm/s TAPSE (M-mode): 1.1 cm LEFT ATRIUM             Index       RIGHT ATRIUM           Index LA diam:        4.10 cm 2.59 cm/m  RA Area:     18.60 cm LA Vol (A2C):   68.5 ml 43.22 ml/m RA Volume:   52.50 ml  33.12 ml/m LA Vol (A4C):   65.6 ml 41.39 ml/m LA Biplane Vol: 68.6 ml 43.28 ml/m  AORTIC VALVE AV Area (Vmax):    1.62 cm AV Area (Vmean):   1.50 cm AV Area (VTI):     1.50 cm AV Vmax:           190.25 cm/s AV Vmean:          129.393 cm/s AV VTI:            0.433 m AV Peak Grad:      14.5 mmHg AV Mean Grad:      7.6 mmHg LVOT Vmax:         98.40 cm/s LVOT Vmean:        61.900 cm/s LVOT VTI:          0.206 m LVOT/AV VTI ratio: 0.48  AORTA Ao Root diam: 2.60 cm MITRAL VALVE                TRICUSPID VALVE MV Area (PHT): 4.06 cm     TR Peak grad:   44.6 mmHg MV Peak grad:  10.8 mmHg    TR Vmax:        334.00 cm/s MV Mean grad:  3.0 mmHg MV Vmax:       1.64 m/s     SHUNTS MV Vmean:      80.6 cm/s    Systemic VTI:  0.21 m MV Decel Time: 187 msec     Systemic Diam: 2.00 cm MV E velocity: 157.00 cm/s MV A velocity: 110.00 cm/s MV E/A ratio:  1.43 Mihai Croitoru MD Electronically signed by Thurmon Fair MD Signature Date/Time: 08/29/2020/12:09:40 PM    Final    DG ESOPHAGUS W SINGLE CM (SOL OR THIN BA)  Result Date: 08/29/2020 CLINICAL DATA:  Dysphagia and globus sensation. EXAM: ESOPHOGRAM/BARIUM SWALLOW TECHNIQUE: Single contrast examination was performed using thin density barium. FLUOROSCOPY TIME:  Fluoroscopy Time:  2 minutes and 54 seconds Radiation Exposure Index (if provided by the fluoroscopic device): 36.4 mGy Number of Acquired Spot Images: 0 COMPARISON:  None. FINDINGS: Initial barium  swallows demonstrate normal pharyngeal motion with swallowing. No laryngeal penetration or aspiration. No upper esophageal webs, strictures or diverticuli. Esophageal dysmotility with disruption of the primary peristaltic wave, tertiary contractions, intermittent esophageal spasm and mild to moderate esophageal stasis. Small sliding-type hiatal hernia is noted. No GE reflux was demonstrated. No esophageal mass. There is mild smooth narrowing near the  GE junction. The 13 mm barium pill lodged in this area but did eventually pass into the stomach. IMPRESSION: 1. Esophageal dysmotility. 2. Small sliding-type hiatal hernia but no demonstrable GE reflux. 3. Mild smooth narrowing near the GE junction. The 13 mm barium pill lodged in this area but did eventually pass. Electronically Signed   By: Rudie Meyer M.D.   On: 08/29/2020 14:40    Impression: Dysphagia:  Barium swallow 08/29/20: Esophageal dysmotility.  Small sliding-type hiatal hernia but no demonstrable GE reflux. Mild smooth narrowing near the GE junction. The 13 mm barium pill lodged in this area but did eventually pass.  Acute on chronic heart failure, EF 20-25% as of 12/28 echo  Pericardial effusion on 12/28 echo, no evidence of tamponade  CAD, last dose Plavix 12/28 at 11:00am  CKD  Plan: Options include EGD with dilation once stable from a cardiac standpoint vs. Optimizing PPI therapy and observing clinical course.    I thoroughly discussed this with the patient and patient's son at bedside.  I discussed the procedure to include nature, alternatives (PPI/observation), benefits, and risks (including but not limited to bleeding, infection, perforation/esophageal rupture, anesthesia/cardiac and pulmonary medications).   Discussed with patient that without endoscopic intervention, dysphagia could persist, which could lead to complications such as aspiration. However, with known esophageal dysmotility, dilation may not completely resolve her  dysphagia.  Patient states she wants to think about options before proceeding with EGD.  Diet as per speech recommendations.  Increase Protonix to 40 mg BID.  Continue to hold Plavix if OK from a cardiac standpoint.  If EGD is pursued, we would wait until Plavix has been held a total of 3-5 days (procedure would likely occur between 12/31 and 12/2).  [facility] will follow.    LOS: 1 day   Edrick Kins  PA-C 08/30/2020, 9:48 AM  Contact #  [phone]

## 2020-08-30 NOTE — Progress Notes (Signed)
Hypoglycemic Event  CBG: 67  Treatment: D50 25 mL (12.5 gm)  12.5 g D50 given at 0439  Symptoms: None asymptomatic  Follow-up CBG: Time:0502 CBG Result:133  Possible Reasons for Event: Inadequate meal intake Pt NPO  Comments/MD notified:  M. Katherina Right NP    Ardyth Harps

## 2020-08-30 NOTE — Consult Note (Signed)
Cardiology Consultation:   Patient ID: Shelia Ward MRN: [id number]; DOB: [birth year]  Admit date: 08/28/2020 Date of Consult: 08/30/2020  Primary Care Provider: System, Provider Not In [facility] Cardiologist: Cardiology at [facility] Electrophysiologist:  None    Patient Profile:   Shelia Ward is a 79 y.o. 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 Ashley Royalty.  History of Present Illness:   Shelia Ward followed by audiology in an outside hospital. The patient underwent CABG in August 2014 with LIMA to the LAD and SVG to OM. Has a history of ischemic cardiomyopathy with a EF as low as 20 to 25%. EF noted to normalize by echo in 2017. Echo from August 2020 showed EF of 15%. Most recent echo from 11/20/2019 showed decreased LV function with a EF of 25 to 30%, severe anteroseptal hypokinesis, mildly dilated left and right atrium, mild AS, mild diastolic dysfunction, mild MR, moderate TR, small pericardial effusion.   The patient's son reports the patient has had prior pericardial effusions requiring pericardiocentesis in the past (at least 3). He is not sure if any were emergent. They would occur in the setting of Acute CHF. He says his mother lives alone. She has 3 kids that try and help her out however patient is consistently noncompliant. She often missed doctors appointment, pm doses of medications, and she does not follow a low salt diet. He feels patient cannot take care of herself however patient disagrees. She has no tobacco or alcohol history.   The patient presented to the ER at [facility] 08/28/2020 for shortness of breath and lower leg swelling.  She was brought in by her family who  she is visiting for the holidays. The family reported symptoms have been progressive for the last month. She was taking Lasix 40 daily she had been seen by her primary care earlier that week and was asked to take Lasix 40 twice daily for 1 week. They reported baseline weight of 119 pounds and was currently at 138 pounds. Denied headache chest pain, fever, chills, nausea, vomiting.   In the ED patient was tachypneic, pressure was mildly elevated and she was afebrile. She was placed on supplemental O2. Labs showed BNP 1169, creatinine 1.50, potassium 4.9, albumin 3.5, WBC 9.0, Hgb 12.9. chest x-ray showed cardiac enlargement and mild CHF.  She was given IV Lasix and admitted.   Past Medical History:  Diagnosis Date   CKD (chronic kidney disease)    Diabetes mellitus without complication (HCC)    Hypertension    Renal disorder     Past Surgical History:  Procedure Laterality Date   CARDIAC SURGERY       Home Medications:  Prior to Admission medications   Medication Sig Start Date End Date Taking? Authorizing Provider  aspirin EC 81 MG tablet Take 81 mg by mouth daily. Swallow whole.   Yes [provider]  atorvastatin (LIPITOR) 20 MG tablet Take 20 mg by mouth at bedtime. 06/24/20  Yes [provider]  clopidogrel (PLAVIX) 75 MG tablet Take 75 mg by mouth daily. 08/24/20  Yes [provider]  furosemide (LASIX) 40 MG tablet Take 40 mg by mouth daily. 08/10/20  Yes [provider]  gabapentin (NEURONTIN)  100 MG capsule Take 100 mg by mouth 2 (two) times daily. 07/28/20  Yes [provider]  insulin glargine (LANTUS SOLOSTAR) 100 UNIT/ML Solostar Pen Inject 36 Units into the skin in the morning.   Yes [provider]  levothyroxine (SYNTHROID) 88 MCG tablet Take 88 mcg by mouth daily. 07/24/20  Yes [provider]  losartan (COZAAR) 25 MG tablet Take 25 mg by mouth 2 (two) times daily. 06/30/20  Yes [provider]   metoprolol succinate (TOPROL-XL) 50 MG 24 hr tablet Take 50 mg by mouth every evening. 06/24/20  Yes [provider]  pantoprazole (PROTONIX) 40 MG tablet Take 40 mg by mouth every evening. 07/28/20  Yes [provider]  calcium carbonate (TUMS - DOSED IN MG ELEMENTAL CALCIUM) 500 MG chewable tablet Chew 1 tablet by mouth 2 (two) times daily as needed for indigestion or heartburn.    [provider]    Inpatient Medications: Scheduled Meds:  aspirin EC  81 mg Oral Daily   atorvastatin  20 mg Oral QHS   furosemide  40 mg Intravenous Daily   heparin  5,000 Units Subcutaneous Q8H   insulin aspart  0-9 Units Subcutaneous Q4H   levothyroxine  88 mcg Oral Q0600   metoprolol succinate  50 mg Oral QPM   pantoprazole  40 mg Oral QPM   sodium chloride flush  3 mL Intravenous Q12H   Continuous Infusions:  sodium chloride     dextrose 40 mL/hr at 08/30/20 0543   PRN Meds: sodium chloride, acetaminophen, ondansetron (ZOFRAN) IV, sodium chloride flush  Allergies:   No Known Allergies  Social History:   Social History   Socioeconomic History   Marital status: Widowed    Spouse name: Not on file   Number of children: Not on file   Years of education: Not on file   Highest education level: Not on file  Occupational History   Not on file  Tobacco Use   Smoking status: Never Smoker   Smokeless tobacco: Not on file  Substance and Sexual Activity   Alcohol use: Not Currently   Drug use: Not Currently   Sexual activity: Not Currently  Other Topics Concern   Not on file  Social History Narrative   Not on file   Social Determinants of Health   Financial Resource Strain: Not on file  Food Insecurity: Not on file  Transportation Needs: Not on file  Physical Activity: Not on file  Stress: Not on file  Social Connections: Not on file  Intimate Partner Violence: Not on file    Family History:   Family History  Problem Relation Age of  Onset   Diabetes Mother    Diabetes Father      ROS:  Please see the history of present illness.  All other ROS reviewed and negative.     Physical Exam/Data:   Vitals:   08/29/20 1254 08/29/20 1830 08/29/20 2021 08/30/20 0410  BP: (!) 148/77 (!) 142/77 (!) 152/79 (!) 151/72  Pulse: 74 80 75 68  Resp: (!) 22  18 16   Temp: 99.3 F (37.4 C) 98.6 F (37 C) 98.8 F (37.1 C) 98.2 F (36.8 C)  TempSrc: Oral Oral Oral   SpO2: 95% (!) 89% 98% 100%  Weight:    59.8 kg  Height:        Intake/Output Summary (Last 24 hours) at 08/30/2020 0901 Last data filed at 08/30/2020 0543 Gross per 24 hour  Intake 626.42 ml  Output  150 ml  Net 476.42 ml   Last 3 Weights 08/30/2020 08/29/2020 08/29/2020  Weight (lbs) 131 lb 13.4 oz 132 lb 6.4 oz 138 lb  Weight (kg) 59.8 kg 60.056 kg 62.596 kg     Body mass index is 24.91 kg/m.  General:  Well nourished, well developed, in no acute distress HEENT: normal Lymph: no adenopathy Neck: no JVD Endocrine:  No thryomegaly Vascular: No carotid bruits; FA pulses 2+ bilaterally without bruits  Cardiac:  normal S1, S2; RRR; no murmur  Lungs:  Diffusely diminished  Abd: soft, nontender, no hepatomegaly  Ext: 2+ b/l edema Musculoskeletal:  No deformities, BUE and BLE strength normal and equal Skin: warm and dry  Neuro:  CNs 2-12 intact, no focal abnormalities noted Psych:  Normal affect   EKG:  The EKG was personally reviewed and demonstrates:  pending Telemetry:  Telemetry was personally reviewed and demonstrates:  NSR, HR 60s, PVC  Relevant CV Studies: Echo 11/2019 Left Ventricle: Normal left ventricle size. Systolic function is  moderate to severely abnormal. EF: 25-30%. Severe anteroseptal  hypokinesis. Doppler parameters consistent with mild diastolic  dysfunction. Markedly elevated E/e` ~ 42, suggesting increased LV filling  pressures.   Left Atrium: Left atrium cavity is mildly dilated.   Right Atrium: Right atrium is mildly  dilated.   Aortic Valve: There is probably mild AS.   Mitral Valve: There is mild regurgitation.   Tricuspid Valve: There is moderate regurgitation. The right ventricular  systolic pressure is mod to severely elevated   Pericardium: There is a small circumferential pericardial effusion  noted anterior and posterior to the heart. There is a left pleural  effusion. There is no echocardiographic evidence of tamponade.  Echo 04/22/19 Interpretation Summary  The left ventricle is mildly dilated.  Ejection Fraction = 15%.  Left ventricular systolic function is severely reduced.  Grade I diastolic dysfunction, (abnormal relaxation pattern).  The left atrium is mildly dilated.  There is mild mitral regurgitation.  Right ventricular systolic pressure is 37 mmhg.  There is mild pulmonary hypertension.  There is mild aortic sclerosis.;  No hemodynamically significant valvular aortic stenosis.  Trace to mild aortic regurgitation.  Left Ventricle  The left ventricle is mildly dilated. Ejection Fraction = 15%. Left  ventricular systolic function is severely reduced. Grade I diastolic  dysfunction, (abnormal relaxation pattern). There is severe global  hypokinesis of the left ventricle.    Echo 12/2015 Conclusions  Summary  The examination was technically difficult with suboptimal acoustic  windows.  Estimated LVEF 55-60%.  LV diastolic dysfunction.  Mild left atrial enlargement.  Mild pulmonary hypertension.  Hyperechoic and mildly thickened pericardium.   Myocardial perfusion stress test 10/2013 IMPRESSION:   1. Extremely poor exercise tolerance for age.  2. There is evidence of left ventricular hypertrophy. Blood pressure  response was normal.  3. There is mild global hypokinesis with estimated LVEF of 44%.  4. There is a small, moderate severity distal anteroapical reversible  defect suggestive of some ischemia in the distal left anterior descending  artery  territory.   Laboratory Data:  High Sensitivity Troponin:  No results for input(s): TROPONINIHS in the last 720 hours.   Chemistry Recent Labs  Lab 08/28/20 1212 08/29/20 0511 08/30/20 0453  NA 144 142 140  K 4.0 4.8 3.8  CL 106 106 104  CO2 26 26 27   GLUCOSE 167* 106* 143*  BUN 55* 53* 53*  CREATININE 1.50* 1.47* 1.53*  CALCIUM 9.1 8.8* 8.5*  GFRNONAA 35*  36* 34*  ANIONGAP 12 10 9     Recent Labs  Lab 08/28/20 1212  PROT 6.2*  ALBUMIN 3.5  AST 29  ALT 21  ALKPHOS 44  BILITOT 0.6   Hematology Recent Labs  Lab 08/28/20 1212 08/30/20 0453  WBC 9.0 9.5  RBC 4.10 3.48*  HGB 12.9 10.9*  HCT 40.4 35.4*  MCV 98.5 101.7*  MCH 31.5 31.3  MCHC 31.9 30.8  RDW 14.0 14.2  PLT 140* 104*   BNP Recent Labs  Lab 08/28/20 1212  BNP 1,169.0*    DDimer No results for input(s): DDIMER in the last 168 hours.   Radiology/Studies:  DG Chest 2 View  Result Date: 08/28/2020 CLINICAL DATA:  CHF. EXAM: CHEST - 2 VIEW COMPARISON:  None FINDINGS: Previous median sternotomy and CABG procedure. Aortic atherosclerotic calcifications. Cardiac enlargement. Bilateral pleural effusions and pulmonary vascular congestion noted compatible with mild CHF. No airspace consolidation. The thoracic scoliosis is convex towards the left. IMPRESSION: 1. Cardiac enlargement and mild CHF. Aortic Atherosclerosis (ICD10-I70.0). Electronically Signed   By: Signa Kellaylor  Stroud M.D.   On: 08/28/2020 12:32   DG CHEST PORT 1 VIEW  Result Date: 08/28/2020 CLINICAL DATA:  CHF, weakness, short of breath, choking episode earlier today EXAM: PORTABLE CHEST 1 VIEW COMPARISON:  08/28/2020 at 12:23 p.m. FINDINGS: Single frontal view of the chest demonstrates persistent enlargement the cardiac silhouette. Stable atherosclerosis of the aorta. Postsurgical changes from median sternotomy. Central vascular congestion and small bilateral pleural effusions are again noted. No focal consolidation or pneumothorax. IMPRESSION: 1.  Stable findings of mild congestive heart failure. Electronically Signed   By: Sharlet SalinaMichael  Brown M.D.   On: 08/28/2020 18:55   ECHOCARDIOGRAM COMPLETE  Result Date: 08/29/2020    ECHOCARDIOGRAM REPORT   Patient Name:   Shelia MilchSANDRA Ward Date of Exam: 08/29/2020 Medical Rec #:  [id number]     Height:       61.0 in Accession #:    [id number]    Weight:       132.4 lb Date of Birth:  12/05/1940      BSA:          1.585 m Patient Age:    79 years      BP:           144/54 mmHg Patient Gender: F             HR:           69 bpm. Exam Location:  Inpatient Procedure: 2D Echo and Intracardiac Opacification Agent Indications:    CHF  History:        Patient has no prior history of Echocardiogram examinations.                 Prior CABG; Risk Factors:Hypertension and Diabetes.  Sonographer:    Thurman Coyerasey Kirkpatrick RDCS (AE) Referring Phys: [id number] [facility] IMPRESSIONS  1. No left ventricular thrombus is seen with Definity contrast. Left ventricular ejection fraction, by estimation, is 20 to 25%. The left ventricle has severely decreased function. The left ventricle demonstrates global hypokinesis. The left ventricular  internal cavity size was moderately dilated. Left ventricular diastolic parameters are consistent with Grade II diastolic dysfunction (pseudonormalization). Elevated left atrial pressure. There is mild dyskinesis of the left ventricular, entire apical segment.  2. Right ventricular systolic function is mildly reduced. The right ventricular size is normal. Mildly increased right ventricular wall thickness. There is moderately elevated pulmonary artery systolic pressure. The estimated right ventricular systolic pressure  is 59.6 mmHg.  3. Left atrial size was mild to moderately dilated.  4. Moderate pericardial effusion. The pericardial effusion is circumferential. There is no evidence of cardiac tamponade.  5. The mitral valve is normal in structure. Mild mitral valve regurgitation. Moderate to severe mitral  annular calcification.  6. Tricuspid valve regurgitation is moderate to severe.  7. The aortic valve is tricuspid. Aortic valve regurgitation is trivial. Mild to moderate aortic valve sclerosis/calcification is present, without any evidence of aortic stenosis.  8. The inferior vena cava is dilated in size with <50% respiratory variability, suggesting right atrial pressure of 15 mmHg. Comparison(s): Prior images unable to be directly viewed, comparison made by report only. Findings are similar to 11/09/2019 echo report in [facility]. FINDINGS  Left Ventricle: No left ventricular thrombus is seen with Definity contrast. Left ventricular ejection fraction, by estimation, is 20 to 25%. The left ventricle has severely decreased function. The left ventricle demonstrates global hypokinesis. Mild dyskinesis of the left ventricular, entire apical segment. Definity contrast agent was given IV to delineate the left ventricular endocardial borders. The left ventricular internal cavity size was moderately dilated. There is no left ventricular hypertrophy. Abnormal (paradoxical) septal motion, consistent with left bundle branch block. Left ventricular diastolic parameters are consistent with Grade II diastolic dysfunction (pseudonormalization). Elevated left atrial pressure. Right Ventricle: The right ventricular size is normal. Mildly increased right ventricular wall thickness. Right ventricular systolic function is mildly reduced. There is moderately elevated pulmonary artery systolic pressure. The tricuspid regurgitant velocity is 3.34 m/s, and with an assumed right atrial pressure of 15 mmHg, the estimated right ventricular systolic pressure is 59.6 mmHg. Left Atrium: Left atrial size was mild to moderately dilated. Right Atrium: Right atrial size was normal in size. Pericardium: A moderately sized pericardial effusion is present. The pericardial effusion is circumferential. There is no evidence of cardiac tamponade.  Mitral Valve: The mitral valve is normal in structure. Moderate to severe mitral annular calcification. Mild mitral valve regurgitation, with centrally-directed jet. MV peak gradient, 10.8 mmHg. The mean mitral valve gradient is 3.0 mmHg. Tricuspid Valve: The tricuspid valve is normal in structure. Tricuspid valve regurgitation is moderate to severe. Aortic Valve: The aortic valve is tricuspid. Aortic valve regurgitation is trivial. Mild to moderate aortic valve sclerosis/calcification is present, without any evidence of aortic stenosis. Aortic valve mean gradient measures 7.6 mmHg. Aortic valve peak  gradient measures 14.5 mmHg. Aortic valve area, by VTI measures 1.50 cm. Pulmonic Valve: The pulmonic valve was grossly normal. Pulmonic valve regurgitation is mild. Aorta: The aortic root is normal in size and structure. Venous: The inferior vena cava is dilated in size with less than 50% respiratory variability, suggesting right atrial pressure of 15 mmHg. IAS/Shunts: No atrial level shunt detected by color flow Doppler.  LEFT VENTRICLE PLAX 2D LVIDd:         6.40 cm LVIDs:         4.80 cm LV PW:         0.90 cm LV IVS:        0.80 cm LVOT diam:     2.00 cm LV SV:         65 LV SV Index:   41 LVOT Area:     3.14 cm  RIGHT VENTRICLE RV S prime:     7.25 cm/s TAPSE (M-mode): 1.1 cm LEFT ATRIUM             Index       RIGHT ATRIUM  Index LA diam:        4.10 cm 2.59 cm/m  RA Area:     18.60 cm LA Vol (A2C):   68.5 ml 43.22 ml/m RA Volume:   52.50 ml  33.12 ml/m LA Vol (A4C):   65.6 ml 41.39 ml/m LA Biplane Vol: 68.6 ml 43.28 ml/m  AORTIC VALVE AV Area (Vmax):    1.62 cm AV Area (Vmean):   1.50 cm AV Area (VTI):     1.50 cm AV Vmax:           190.25 cm/s AV Vmean:          129.393 cm/s AV VTI:            0.433 m AV Peak Grad:      14.5 mmHg AV Mean Grad:      7.6 mmHg LVOT Vmax:         98.40 cm/s LVOT Vmean:        61.900 cm/s LVOT VTI:          0.206 m LVOT/AV VTI ratio: 0.48  AORTA Ao Root diam:  2.60 cm MITRAL VALVE                TRICUSPID VALVE MV Area (PHT): 4.06 cm     TR Peak grad:   44.6 mmHg MV Peak grad:  10.8 mmHg    TR Vmax:        334.00 cm/s MV Mean grad:  3.0 mmHg MV Vmax:       1.64 m/s     SHUNTS MV Vmean:      80.6 cm/s    Systemic VTI:  0.21 m MV Decel Time: 187 msec     Systemic Diam: 2.00 cm MV E velocity: 157.00 cm/s MV A velocity: 110.00 cm/s MV E/A ratio:  1.43 Mihai Croitoru MD Electronically signed by Thurmon Fair MD Signature Date/Time: 08/29/2020/12:09:40 PM    Final    DG ESOPHAGUS W SINGLE CM (SOL OR THIN BA)  Result Date: 08/29/2020 CLINICAL DATA:  Dysphagia and globus sensation. EXAM: ESOPHOGRAM/BARIUM SWALLOW TECHNIQUE: Single contrast examination was performed using thin density barium. FLUOROSCOPY TIME:  Fluoroscopy Time:  2 minutes and 54 seconds Radiation Exposure Index (if provided by the fluoroscopic device): 36.4 mGy Number of Acquired Spot Images: 0 COMPARISON:  None. FINDINGS: Initial barium swallows demonstrate normal pharyngeal motion with swallowing. No laryngeal penetration or aspiration. No upper esophageal webs, strictures or diverticuli. Esophageal dysmotility with disruption of the primary peristaltic wave, tertiary contractions, intermittent esophageal spasm and mild to moderate esophageal stasis. Small sliding-type hiatal hernia is noted. No GE reflux was demonstrated. No esophageal mass. There is mild smooth narrowing near the GE junction. The 13 mm barium pill lodged in this area but did eventually pass into the stomach. IMPRESSION: 1. Esophageal dysmotility. 2. Small sliding-type hiatal hernia but no demonstrable GE reflux. 3. Mild smooth narrowing near the GE junction. The 13 mm barium pill lodged in this area but did eventually pass. Electronically Signed   By: Rudie Meyer M.D.   On: 08/29/2020 14:40     Assessment and Plan:   Acute on chronic systolic and diastolic heart failure/ischemic cardiomyopathy with most recent EF 25 to  30% -Patient is followed cardiology at an outside hospital however has not followed up in the last year. She was brought in for 1 month of shortness of breath and lower leg edema.  Prior to admission she was on Lasix daily however has been taking  Lasix twice daily for 1 week without improvement.  The ER BNP was elevated.  Chest x-ray suggestive of CHF.  Patient has a history of noncompliance with meds and diet - PTA lasix 40 mg daily. She did not improve with increased lasix dose as OP -Patient was started on IV Lasix 40 daily -Echo here showed EF 20 to 25%, global hypokinesis, moderate dilation of the LV, grade 2 diastolic dysfunction, elevated left atrial pressure, mild dyskinesis of the LV, mildly reduced RV function, moderately elevated pulmonary artery systolic pressure, mild to moderate dilation of the left atrium, moderate pericardial effusion no evidence of tamponade, mild MR, moderate to severe TR -Creatinine stable, 1.47> 1.53. Baseline creatinine around 1.3 -Patient had poor urine output overnight, however weight is down since admission -consider increasing Lasix dose   Pericardial effusion - Son reported prior pericardial effusions int he past (3) requiring pericardiocentesis -Pericardial effusion yy echo 08/29/2020 which showed moderate pericardial effusion with no evidence of tamponade -continue to monitor for signs of tamponade - will check CRP/sed rate and EKG -Repeat echo couple days to assess effusion. Will discuss plan with MD  CAD s/p CABGx 2 in 2014 -Patient denies chest pain -Continue aspirin, atorvastatin 20 mg daily, Toprol-XL 50 mg daily -Home med Plavix was held due to dysphagia for possible intervention - check EKG  Hypertension -PTA Toprol 50 daily, losartan 25 mg twice daily -ARB held for CKD -IV Lasix  Hyperlipidemia -Continue statin -LDL 57 in December 2021  CKD stage III -Monitor creatinine with diuresis  Diabetes type 2 -SSI per primary  team  Dysphagia/esophageal narrowing -Imaging showed esophageal narrowing.  She is having trouble with dysphagia -She is n.p.o. -GI consulted and Plavix was held in anticipation of possible intervention  Noncompliance - complicated situation since patient lives alone and is not fully accepting help from the kids   For questions or updates, please contact [facility] Please consult [email or web address] for contact info under    Signed, Bayleigh Loflin David Stall, PA-C  08/30/2020 9:01 AM

## 2020-08-30 NOTE — Progress Notes (Signed)
PROGRESS NOTE    Rhaya Coale  HMC:[id number] DOB: Sep 02, 1941 DOA: 08/28/2020 PCP: System, Provider Not In   Brief Narrative:Kataleya Oakleyis a 79 y.o.femalewith Past medical history ofHTN, CKD 3B, type II DM, chronic systolic CHF, CAD, noncompliance. Patient presented with complaints of leg swelling and shortness of breath.  Found to have acute on chronic systolic CHF.  Also has dysphagia to solid food for which GI is consulted Currently plan is further dysphagia work-up.   Assessment & Plan:   Active Problems:   Acute on chronic combined systolic (congestive) and diastolic (congestive) heart failure (HCC)   #1Dysphagia with esophageal narrowing -appreciate GI input.  Patient still thinking about whether to have EGD procedure done or not.  Discussed with her son.  We will continue to hold Plavix in case if she makes up her mind to do EGD.  Son reported that she ate a lunch applesauce and potatoes without any difficulty.  #2 Acute on chronic combined systolic and diastolic chf appreciate cardiology input.  Continue Lasix. Echocardiogram shows EF of 20 to 25% with global hypokinesis.  Grade 2 diastolic dysfunction.  Moderate pericardial effusion without any cardiac tamponade with moderate to severe TR.  #3 Essential HTN -continue above meds  #4 CKD STAGE 3 B monitor renal functions on diuretics.  #5 Type 2 DM uncontrolled with hyperglycemia patient restarted on dysphagia 1 diet.  She was hypoglycemic earlier today and D10 was continued. CBG (last 3)  Recent Labs    08/30/20 0502 08/30/20 0742 08/30/20 1128  GLUCAP 133* 104* 120*   #6 hypothyroidism on Synthroid.    Estimated body mass index is 24.91 kg/m as calculated from the following:   Height as of this encounter: 5\' 1"  (1.549 m).   Weight as of this encounter: 59.8 kg.  DVT prophylaxis: Heparin Code Status: Partial code Family Communication: Discussed with son in detail at Disposition Plan:  Status  is: Inpatient  Dispo: The patient is from: Home              Anticipated d/c is to: Home              Anticipated d/c date is: 2 days              Patient currently is not medically stable to d/c.    Consultants:   GI and cardiology  Procedures: None Antimicrobials: None Subjective: Patient is resting in bed night nurse reported hypoglycemia on D10 drip  Objective: Vitals:   08/29/20 1830 08/29/20 2021 08/30/20 0410 08/30/20 1428  BP: (!) 142/77 (!) 152/79 (!) 151/72 (!) 149/82  Pulse: 80 75 68 68  Resp:  18 16 20   Temp: 98.6 F (37 C) 98.8 F (37.1 C) 98.2 F (36.8 C) 97.9 F (36.6 C)  TempSrc: Oral Oral  Oral  SpO2: (!) 89% 98% 100% 100%  Weight:   59.8 kg   Height:        Intake/Output Summary (Last 24 hours) at 08/30/2020 1529 Last data filed at 08/30/2020 1500 Gross per 24 hour  Intake 983.42 ml  Output 150 ml  Net 833.42 ml   Filed Weights   08/29/20 0037 08/29/20 0856 08/30/20 0410  Weight: 62.6 kg 60.1 kg 59.8 kg    Examination:  General exam: Appears calm and comfortable  Respiratory system: Diminished breath sounds at the bases to auscultation. Respiratory effort normal. Cardiovascular system: S1 & S2 heard, RRR. No JVD, murmurs, rubs, gallops or clicks. No pedal edema. Gastrointestinal system: Abdomen  is nondistended, soft and nontender. No organomegaly or masses felt. Normal bowel sounds heard. Central nervous system: Alert and oriented. No focal neurological deficits. Extremities: Decreasing edema wrinkled skin 2+ edema Skin: No rashes, lesions or ulcers Psychiatry: Judgement and insight appear normal. Mood & affect appropriate.     Data Reviewed: I have personally reviewed following labs and imaging studies  CBC: Recent Labs  Lab 08/28/20 1212 08/30/20 0453  WBC 9.0 9.5  NEUTROABS  --  7.0  HGB 12.9 10.9*  HCT 40.4 35.4*  MCV 98.5 101.7*  PLT 140* 104*   Basic Metabolic Panel: Recent Labs  Lab 08/28/20 1212 08/29/20 0511  08/30/20 0453  NA 144 142 140  K 4.0 4.8 3.8  CL 106 106 104  CO2 26 26 27   GLUCOSE 167* 106* 143*  BUN 55* 53* 53*  CREATININE 1.50* 1.47* 1.53*  CALCIUM 9.1 8.8* 8.5*  MG  --   --  2.1   GFR: Estimated Creatinine Clearance: 23.9 mL/min (A) (by C-G formula based on SCr of 1.53 mg/dL (H)). Liver Function Tests: Recent Labs  Lab 08/28/20 1212  AST 29  ALT 21  ALKPHOS 44  BILITOT 0.6  PROT 6.2*  ALBUMIN 3.5   No results for input(s): LIPASE, AMYLASE in the last 168 hours. No results for input(s): AMMONIA in the last 168 hours. Coagulation Profile: No results for input(s): INR, PROTIME in the last 168 hours. Cardiac Enzymes: No results for input(s): CKTOTAL, CKMB, CKMBINDEX, TROPONINI in the last 168 hours. BNP (last 3 results) No results for input(s): PROBNP in the last 8760 hours. HbA1C: Recent Labs    08/29/20 0511  HGBA1C 8.9*   CBG: Recent Labs  Lab 08/30/20 0019 08/30/20 0417 08/30/20 0502 08/30/20 0742 08/30/20 1128  GLUCAP 79 67* 133* 104* 120*   Lipid Profile: No results for input(s): CHOL, HDL, LDLCALC, TRIG, CHOLHDL, LDLDIRECT in the last 72 hours. Thyroid Function Tests: No results for input(s): TSH, T4TOTAL, FREET4, T3FREE, THYROIDAB in the last 72 hours. Anemia Panel: No results for input(s): VITAMINB12, FOLATE, FERRITIN, TIBC, IRON, RETICCTPCT in the last 72 hours. Sepsis Labs: No results for input(s): PROCALCITON, LATICACIDVEN in the last 168 hours.  Recent Results (from the past 240 hour(s))  SARS CORONAVIRUS 2 (TAT 6-24 HRS) Nasopharyngeal Nasopharyngeal Swab     Status: None   Collection Time: 08/28/20  1:53 PM   Specimen: Nasopharyngeal Swab  Result Value Ref Range Status   SARS Coronavirus 2 NEGATIVE NEGATIVE Final    Comment: (NOTE) SARS-CoV-2 target nucleic acids are NOT DETECTED.  The SARS-CoV-2 RNA is generally detectable in upper and lower respiratory specimens during the acute phase of infection. Negative results do not  preclude SARS-CoV-2 infection, do not rule out co-infections with other pathogens, and should not be used as the sole basis for treatment or other patient management decisions. Negative results must be combined with clinical observations, patient history, and epidemiological information. The expected result is Negative.  Fact Sheet for Patients: 08/30/20  Fact Sheet for Healthcare Providers: HairSlick.no  This test is not yet approved or cleared by the quierodir[employer]ir.[employer] FDA and  has been authorized for detection and/or diagnosis of SARS-CoV-2 by FDA under an Emergency Use Authorization (EUA). This EUA will remain  in effect (meaning this test can be used) for the duration of the COVID-19 declaration under Se ction 564(b)(1) of the Act, 21 U.S.C. section 360bbb-3(b)(1), unless the authorization is terminated or revoked sooner.  Performed at [facility], [address] [place], [place] [address]          Radiology Studies: DG CHEST PORT 1 VIEW  Result Date: 08/28/2020 CLINICAL DATA:  CHF, weakness, short of breath, choking episode earlier today EXAM: PORTABLE CHEST 1 VIEW COMPARISON:  08/28/2020 at 12:23 p.m. FINDINGS: Single frontal view of the chest demonstrates persistent enlargement the cardiac silhouette. Stable atherosclerosis of the aorta. Postsurgical changes from median sternotomy. Central vascular congestion and small bilateral pleural effusions are again noted. No focal consolidation or pneumothorax. IMPRESSION: 1. Stable findings of mild congestive heart failure. Electronically Signed   By: Sharlet Salina M.D.   On: 08/28/2020 18:55   ECHOCARDIOGRAM COMPLETE  Result Date: 08/29/2020    ECHOCARDIOGRAM REPORT   Patient Name:   JILLIENNE EGNER Date of Exam: 08/29/2020 Medical Rec #:  [id number]     Height:       61.0 in Accession #:    [id number]    Weight:       132.4 lb Date of Birth:  1940-11-29      BSA:           1.585 m Patient Age:    79 years      BP:           144/54 mmHg Patient Gender: F             HR:           69 bpm. Exam Location:  Inpatient Procedure: 2D Echo and Intracardiac Opacification Agent Indications:    CHF  History:        Patient has no prior history of Echocardiogram examinations.                 Prior CABG; Risk Factors:Hypertension and Diabetes.  Sonographer:    Thurman Coyer RDCS (AE) Referring Phys: [id number] [facility] M PATEL IMPRESSIONS  1. No left ventricular thrombus is seen with Definity contrast. Left ventricular ejection fraction, by estimation, is 20 to 25%. The left ventricle has severely decreased function. The left ventricle demonstrates global hypokinesis. The left ventricular  internal cavity size was moderately dilated. Left ventricular diastolic parameters are consistent with Grade II diastolic dysfunction (pseudonormalization). Elevated left atrial pressure. There is mild dyskinesis of the left ventricular, entire apical segment.  2. Right ventricular systolic function is mildly reduced. The right ventricular size is normal. Mildly increased right ventricular wall thickness. There is moderately elevated pulmonary artery systolic pressure. The estimated right ventricular systolic pressure is 59.6 mmHg.  3. Left atrial size was mild to moderately dilated.  4. Moderate pericardial effusion. The pericardial effusion is circumferential. There is no evidence of cardiac tamponade.  5. The mitral valve is normal in structure. Mild mitral valve regurgitation. Moderate to severe mitral annular calcification.  6. Tricuspid valve regurgitation is moderate to severe.  7. The aortic valve is tricuspid. Aortic valve regurgitation is trivial. Mild to moderate aortic valve sclerosis/calcification is present, without any evidence of aortic stenosis.  8. The inferior vena cava is dilated in size with <50% respiratory variability, suggesting right atrial pressure of 15 mmHg. Comparison(s): Prior  images unable to be directly viewed, comparison made by report only. Findings are similar to 11/09/2019 echo report in [facility]. FINDINGS  Left Ventricle: No left ventricular thrombus is seen with Definity contrast. Left ventricular ejection fraction, by estimation, is 20 to 25%. The left ventricle has severely decreased function. The left ventricle demonstrates global hypokinesis. Mild dyskinesis of the left ventricular, entire apical segment. Definity contrast  agent was given IV to delineate the left ventricular endocardial borders. The left ventricular internal cavity size was moderately dilated. There is no left ventricular hypertrophy. Abnormal (paradoxical) septal motion, consistent with left bundle branch block. Left ventricular diastolic parameters are consistent with Grade II diastolic dysfunction (pseudonormalization). Elevated left atrial pressure. Right Ventricle: The right ventricular size is normal. Mildly increased right ventricular wall thickness. Right ventricular systolic function is mildly reduced. There is moderately elevated pulmonary artery systolic pressure. The tricuspid regurgitant velocity is 3.34 m/s, and with an assumed right atrial pressure of 15 mmHg, the estimated right ventricular systolic pressure is 59.6 mmHg. Left Atrium: Left atrial size was mild to moderately dilated. Right Atrium: Right atrial size was normal in size. Pericardium: A moderately sized pericardial effusion is present. The pericardial effusion is circumferential. There is no evidence of cardiac tamponade. Mitral Valve: The mitral valve is normal in structure. Moderate to severe mitral annular calcification. Mild mitral valve regurgitation, with centrally-directed jet. MV peak gradient, 10.8 mmHg. The mean mitral valve gradient is 3.0 mmHg. Tricuspid Valve: The tricuspid valve is normal in structure. Tricuspid valve regurgitation is moderate to severe. Aortic Valve: The aortic valve is tricuspid. Aortic valve  regurgitation is trivial. Mild to moderate aortic valve sclerosis/calcification is present, without any evidence of aortic stenosis. Aortic valve mean gradient measures 7.6 mmHg. Aortic valve peak  gradient measures 14.5 mmHg. Aortic valve area, by VTI measures 1.50 cm. Pulmonic Valve: The pulmonic valve was grossly normal. Pulmonic valve regurgitation is mild. Aorta: The aortic root is normal in size and structure. Venous: The inferior vena cava is dilated in size with less than 50% respiratory variability, suggesting right atrial pressure of 15 mmHg. IAS/Shunts: No atrial level shunt detected by color flow Doppler.  LEFT VENTRICLE PLAX 2D LVIDd:         6.40 cm LVIDs:         4.80 cm LV PW:         0.90 cm LV IVS:        0.80 cm LVOT diam:     2.00 cm LV SV:         65 LV SV Index:   41 LVOT Area:     3.14 cm  RIGHT VENTRICLE RV S prime:     7.25 cm/s TAPSE (M-mode): 1.1 cm LEFT ATRIUM             Index       RIGHT ATRIUM           Index LA diam:        4.10 cm 2.59 cm/m  RA Area:     18.60 cm LA Vol (A2C):   68.5 ml 43.22 ml/m RA Volume:   52.50 ml  33.12 ml/m LA Vol (A4C):   65.6 ml 41.39 ml/m LA Biplane Vol: 68.6 ml 43.28 ml/m  AORTIC VALVE AV Area (Vmax):    1.62 cm AV Area (Vmean):   1.50 cm AV Area (VTI):     1.50 cm AV Vmax:           190.25 cm/s AV Vmean:          129.393 cm/s AV VTI:            0.433 m AV Peak Grad:      14.5 mmHg AV Mean Grad:      7.6 mmHg LVOT Vmax:         98.40 cm/s LVOT Vmean:  61.900 cm/s LVOT VTI:          0.206 m LVOT/AV VTI ratio: 0.48  AORTA Ao Root diam: 2.60 cm MITRAL VALVE                TRICUSPID VALVE MV Area (PHT): 4.06 cm     TR Peak grad:   44.6 mmHg MV Peak grad:  10.8 mmHg    TR Vmax:        334.00 cm/s MV Mean grad:  3.0 mmHg MV Vmax:       1.64 m/s     SHUNTS MV Vmean:      80.6 cm/s    Systemic VTI:  0.21 m MV Decel Time: 187 msec     Systemic Diam: 2.00 cm MV E velocity: 157.00 cm/s MV A velocity: 110.00 cm/s MV E/A ratio:  1.43 Mihai Croitoru MD  Electronically signed by Thurmon Fair MD Signature Date/Time: 08/29/2020/12:09:40 PM    Final    DG ESOPHAGUS W SINGLE CM (SOL OR THIN BA)  Result Date: 08/29/2020 CLINICAL DATA:  Dysphagia and globus sensation. EXAM: ESOPHOGRAM/BARIUM SWALLOW TECHNIQUE: Single contrast examination was performed using thin density barium. FLUOROSCOPY TIME:  Fluoroscopy Time:  2 minutes and 54 seconds Radiation Exposure Index (if provided by the fluoroscopic device): 36.4 mGy Number of Acquired Spot Images: 0 COMPARISON:  None. FINDINGS: Initial barium swallows demonstrate normal pharyngeal motion with swallowing. No laryngeal penetration or aspiration. No upper esophageal webs, strictures or diverticuli. Esophageal dysmotility with disruption of the primary peristaltic wave, tertiary contractions, intermittent esophageal spasm and mild to moderate esophageal stasis. Small sliding-type hiatal hernia is noted. No GE reflux was demonstrated. No esophageal mass. There is mild smooth narrowing near the GE junction. The 13 mm barium pill lodged in this area but did eventually pass into the stomach. IMPRESSION: 1. Esophageal dysmotility. 2. Small sliding-type hiatal hernia but no demonstrable GE reflux. 3. Mild smooth narrowing near the GE junction. The 13 mm barium pill lodged in this area but did eventually pass. Electronically Signed   By: Rudie Meyer M.D.   On: 08/29/2020 14:40        Scheduled Meds:  aspirin EC  81 mg Oral Daily   atorvastatin  20 mg Oral QHS   furosemide  80 mg Intravenous BID   heparin  5,000 Units Subcutaneous Q8H   insulin aspart  0-9 Units Subcutaneous Q4H   levothyroxine  88 mcg Oral Q0600   metoprolol succinate  50 mg Oral QPM   pantoprazole  40 mg Oral QPM   sodium chloride flush  3 mL Intravenous Q12H   Continuous Infusions:  sodium chloride     dextrose 40 mL/hr at 08/30/20 1457     LOS: 1 day     Alwyn Ren, MD  08/30/2020, 3:29 PM

## 2020-08-30 NOTE — Plan of Care (Signed)

## 2020-08-31 DIAGNOSIS — I509 Heart failure, unspecified: Secondary | ICD-10-CM | POA: Diagnosis not present

## 2020-08-31 DIAGNOSIS — I5043 Acute on chronic combined systolic (congestive) and diastolic (congestive) heart failure: Secondary | ICD-10-CM | POA: Diagnosis not present

## 2020-08-31 LAB — BASIC METABOLIC PANEL
Anion gap: 8 (ref 5–15)
BUN: 53 mg/dL — ABNORMAL HIGH (ref 8–23)
CO2: 29 mmol/L (ref 22–32)
Calcium: 8.8 mg/dL — ABNORMAL LOW (ref 8.9–10.3)
Chloride: 101 mmol/L (ref 98–111)
Creatinine, Ser: 1.55 mg/dL — ABNORMAL HIGH (ref 0.44–1.00)
GFR, Estimated: 33 mL/min — ABNORMAL LOW (ref >60)
Glucose, Bld: 115 mg/dL — ABNORMAL HIGH (ref 70–99)
Potassium: 4.1 mmol/L (ref 3.5–5.1)
Sodium: 138 mmol/L (ref 135–145)

## 2020-08-31 LAB — GLUCOSE, CAPILLARY
Glucose-Capillary: 107 mg/dL — ABNORMAL HIGH (ref 70–99)
Glucose-Capillary: 97 mg/dL (ref 70–99)
Glucose-Capillary: 155 mg/dL — ABNORMAL HIGH (ref 70–99)
Glucose-Capillary: 171 mg/dL — ABNORMAL HIGH (ref 70–99)
Glucose-Capillary: 209 mg/dL — ABNORMAL HIGH (ref 70–99)

## 2020-08-31 MED ORDER — PANTOPRAZOLE SODIUM 40 MG PO TBEC: 40.0000 mg | DELAYED_RELEASE_TABLET | Freq: Two times a day (BID) | ORAL | 2 refills | Status: AC

## 2020-08-31 MED ORDER — FUROSEMIDE 40 MG PO TABS: 40.0000 mg | ORAL_TABLET | Freq: Two times a day (BID) | ORAL | 2 refills | Status: AC

## 2020-08-31 MED ORDER — FUROSEMIDE 40 MG PO TABS
80.0000 mg | ORAL_TABLET | Freq: Two times a day (BID) | ORAL | Status: DC
  Administered 2020-08-31 – 2020-09-01 (×3): 80 mg via ORAL
  Filled 2020-08-31 (×3): qty 2

## 2020-08-31 NOTE — Discharge Summary (Signed)
Physician Discharge Summary  Shelia Ward DXI:[id number] DOB: 12/28/1940 DOA: 08/28/2020  PCP: System, Provider Not In  Admit date: 08/28/2020 Discharge date: 08/31/2020  Admitted From:home Disposition: home Recommendations for Outpatient Follow-up:  1. Follow up with PCP in 1-2 weeks 2. Please obtain BMP/CBC in one week  Home Health:yes Equipment/Devices:oxygen  Discharge Condition:stable CODE STATUS:full Diet recommendation: cardiac Brief/Interim Summary:Shelia Oakleyis a 79 y.o.femalewith Past medical history ofHTN, CKD 3B, type II DM, chronic systolic CHF, CAD, noncompliance. Patient presented with complaints of leg swelling and shortness of breath.Found to have acute on chronic systolic CHF. Also has dysphagia to solid food for which GI is consulted Currently plan isfurther dysphagia work-up.   Discharge Diagnoses:  Active Problems:   Acute on chronic combined systolic (congestive) and diastolic (congestive) heart failure (HCC)   Acute congestive heart failure (HCC)   #1Dysphagia with esophageal narrowing -patient tolerated grits and coffee this morning. She continues to refuse EGD. She is adamant that she does not want the procedure and wants to go home. Daughter at bedside. Discussed with son on the phone yesterday.   #2 Acute on chronic combined systolic and diastolic chf-she was seen by cardiology. She was on Lasix 40 mg daily prior to admission I will discharge her on 40 twice a day.  Echocardiogramshows EF of 20 to 25% with global hypokinesis.Grade 2 diastolic dysfunction. Moderate pericardial effusion without any cardiac tamponade with moderate to severe TR.  #3 Essential HTN -continue home medications.  #4 CKD STAGE 3 B  stable need to monitor as an outpatient on diuretics.  #5 Type 2 DM uncontrolled with hyperglycemia continue home meds.  #6 hypothyroidism on Synthroid.    Estimated body mass index is 24.62 kg/m as calculated from the  following:   Height as of this encounter: 5\' 1"  (1.549 m).   Weight as of this encounter: 59.1 kg.  Discharge Instructions  Discharge Instructions    Diet - low sodium heart healthy   Complete by: As directed    Increase activity slowly   Complete by: As directed      Allergies as of 08/31/2020   No Known Allergies     Medication List    STOP taking these medications   losartan 25 MG tablet Commonly known as: COZAAR     TAKE these medications   aspirin EC 81 MG tablet Take 81 mg by mouth daily. Swallow whole.   atorvastatin 20 MG tablet Commonly known as: LIPITOR Take 20 mg by mouth at bedtime.   calcium carbonate 500 MG chewable tablet Commonly known as: TUMS - dosed in mg elemental calcium Chew 1 tablet by mouth 2 (two) times daily as needed for indigestion or heartburn.   clopidogrel 75 MG tablet Commonly known as: PLAVIX Take 75 mg by mouth daily.   furosemide 40 MG tablet Commonly known as: LASIX Take 1 tablet (40 mg total) by mouth 2 (two) times daily. What changed: when to take this   gabapentin 100 MG capsule Commonly known as: NEURONTIN Take 100 mg by mouth 2 (two) times daily.   Lantus SoloStar 100 UNIT/ML Solostar Pen Generic drug: insulin glargine Inject 36 Units into the skin in the morning.   levothyroxine 88 MCG tablet Commonly known as: SYNTHROID Take 88 mcg by mouth daily.   metoprolol succinate 50 MG 24 hr tablet Commonly known as: TOPROL-XL Take 50 mg by mouth every evening.   pantoprazole 40 MG tablet Commonly known as: PROTONIX Take 40 mg by mouth every evening.  No Known Allergies  Consultations: Cardiology and GI  Procedures/Studies: DG Chest 2 View  Result Date: 08/28/2020 CLINICAL DATA:  CHF. EXAM: CHEST - 2 VIEW COMPARISON:  None FINDINGS: Previous median sternotomy and CABG procedure. Aortic atherosclerotic calcifications. Cardiac enlargement. Bilateral pleural effusions and pulmonary vascular congestion  noted compatible with mild CHF. No airspace consolidation. The thoracic scoliosis is convex towards the left. IMPRESSION: 1. Cardiac enlargement and mild CHF. Aortic Atherosclerosis (ICD10-I70.0). Electronically Signed   By: Signa Kell M.D.   On: 08/28/2020 12:32   DG CHEST PORT 1 VIEW  Result Date: 08/28/2020 CLINICAL DATA:  CHF, weakness, short of breath, choking episode earlier today EXAM: PORTABLE CHEST 1 VIEW COMPARISON:  08/28/2020 at 12:23 p.m. FINDINGS: Single frontal view of the chest demonstrates persistent enlargement the cardiac silhouette. Stable atherosclerosis of the aorta. Postsurgical changes from median sternotomy. Central vascular congestion and small bilateral pleural effusions are again noted. No focal consolidation or pneumothorax. IMPRESSION: 1. Stable findings of mild congestive heart failure. Electronically Signed   By: Sharlet Salina M.D.   On: 08/28/2020 18:55   ECHOCARDIOGRAM COMPLETE  Result Date: 08/29/2020    ECHOCARDIOGRAM REPORT   Patient Name:   Shelia Ward Date of Exam: 08/29/2020 Medical Rec #:  [id number]     Height:       61.0 in Accession #:    [id number]    Weight:       132.4 lb Date of Birth:  Dec 18, 1940      BSA:          1.585 m Patient Age:    79 years      BP:           144/54 mmHg Patient Gender: F             HR:           69 bpm. Exam Location:  Inpatient Procedure: 2D Echo and Intracardiac Opacification Agent Indications:    CHF  History:        Patient has no prior history of Echocardiogram examinations.                 Prior CABG; Risk Factors:Hypertension and Diabetes.  Sonographer:    Thurman Coyer RDCS (AE) Referring Phys: [id number] Shelia Ward IMPRESSIONS  1. No left ventricular thrombus is seen with Definity contrast. Left ventricular ejection fraction, by estimation, is 20 to 25%. The left ventricle has severely decreased function. The left ventricle demonstrates global hypokinesis. The left ventricular  internal cavity size was moderately  dilated. Left ventricular diastolic parameters are consistent with Grade II diastolic dysfunction (pseudonormalization). Elevated left atrial pressure. There is mild dyskinesis of the left ventricular, entire apical segment.  2. Right ventricular systolic function is mildly reduced. The right ventricular size is normal. Mildly increased right ventricular wall thickness. There is moderately elevated pulmonary artery systolic pressure. The estimated right ventricular systolic pressure is 59.6 mmHg.  3. Left atrial size was mild to moderately dilated.  4. Moderate pericardial effusion. The pericardial effusion is circumferential. There is no evidence of cardiac tamponade.  5. The mitral valve is normal in structure. Mild mitral valve regurgitation. Moderate to severe mitral annular calcification.  6. Tricuspid valve regurgitation is moderate to severe.  7. The aortic valve is tricuspid. Aortic valve regurgitation is trivial. Mild to moderate aortic valve sclerosis/calcification is present, without any evidence of aortic stenosis.  8. The inferior vena cava is dilated in size with <50% respiratory variability, suggesting right  atrial pressure of 15 mmHg. Comparison(s): Prior images unable to be directly viewed, comparison made by report only. Findings are similar to 11/09/2019 echo report in [facility]. FINDINGS  Left Ventricle: No left ventricular thrombus is seen with Definity contrast. Left ventricular ejection fraction, by estimation, is 20 to 25%. The left ventricle has severely decreased function. The left ventricle demonstrates global hypokinesis. Mild dyskinesis of the left ventricular, entire apical segment. Definity contrast agent was given IV to delineate the left ventricular endocardial borders. The left ventricular internal cavity size was moderately dilated. There is no left ventricular hypertrophy. Abnormal (paradoxical) septal motion, consistent with left bundle branch block. Left ventricular  diastolic parameters are consistent with Grade II diastolic dysfunction (pseudonormalization). Elevated left atrial pressure. Right Ventricle: The right ventricular size is normal. Mildly increased right ventricular wall thickness. Right ventricular systolic function is mildly reduced. There is moderately elevated pulmonary artery systolic pressure. The tricuspid regurgitant velocity is 3.34 m/s, and with an assumed right atrial pressure of 15 mmHg, the estimated right ventricular systolic pressure is 59.6 mmHg. Left Atrium: Left atrial size was mild to moderately dilated. Right Atrium: Right atrial size was normal in size. Pericardium: A moderately sized pericardial effusion is present. The pericardial effusion is circumferential. There is no evidence of cardiac tamponade. Mitral Valve: The mitral valve is normal in structure. Moderate to severe mitral annular calcification. Mild mitral valve regurgitation, with centrally-directed jet. MV peak gradient, 10.8 mmHg. The mean mitral valve gradient is 3.0 mmHg. Tricuspid Valve: The tricuspid valve is normal in structure. Tricuspid valve regurgitation is moderate to severe. Aortic Valve: The aortic valve is tricuspid. Aortic valve regurgitation is trivial. Mild to moderate aortic valve sclerosis/calcification is present, without any evidence of aortic stenosis. Aortic valve mean gradient measures 7.6 mmHg. Aortic valve peak  gradient measures 14.5 mmHg. Aortic valve area, by VTI measures 1.50 cm. Pulmonic Valve: The pulmonic valve was grossly normal. Pulmonic valve regurgitation is mild. Aorta: The aortic root is normal in size and structure. Venous: The inferior vena cava is dilated in size with less than 50% respiratory variability, suggesting right atrial pressure of 15 mmHg. IAS/Shunts: No atrial level shunt detected by color flow Doppler.  LEFT VENTRICLE PLAX 2D LVIDd:         6.40 cm LVIDs:         4.80 cm LV PW:         0.90 cm LV IVS:        0.80 cm LVOT diam:      2.00 cm LV SV:         65 LV SV Index:   41 LVOT Area:     3.14 cm  RIGHT VENTRICLE RV S prime:     7.25 cm/s TAPSE (M-mode): 1.1 cm LEFT ATRIUM             Index       RIGHT ATRIUM           Index LA diam:        4.10 cm 2.59 cm/m  RA Area:     18.60 cm LA Vol (A2C):   68.5 ml 43.22 ml/m RA Volume:   52.50 ml  33.12 ml/m LA Vol (A4C):   65.6 ml 41.39 ml/m LA Biplane Vol: 68.6 ml 43.28 ml/m  AORTIC VALVE AV Area (Vmax):    1.62 cm AV Area (Vmean):   1.50 cm AV Area (VTI):     1.50 cm AV Vmax:  190.25 cm/s AV Vmean:          129.393 cm/s AV VTI:            0.433 m AV Peak Grad:      14.5 mmHg AV Mean Grad:      7.6 mmHg LVOT Vmax:         98.40 cm/s LVOT Vmean:        61.900 cm/s LVOT VTI:          0.206 m LVOT/AV VTI ratio: 0.48  AORTA Ao Root diam: 2.60 cm MITRAL VALVE                TRICUSPID VALVE MV Area (PHT): 4.06 cm     TR Peak grad:   44.6 mmHg MV Peak grad:  10.8 mmHg    TR Vmax:        334.00 cm/s MV Mean grad:  3.0 mmHg MV Vmax:       1.64 m/s     SHUNTS MV Vmean:      80.6 cm/s    Systemic VTI:  0.21 m MV Decel Time: 187 msec     Systemic Diam: 2.00 cm MV E velocity: 157.00 cm/s MV A velocity: 110.00 cm/s MV E/A ratio:  1.43 Mihai Croitoru MD Electronically signed by Thurmon Fair MD Signature Date/Time: 08/29/2020/12:09:40 PM    Final    DG ESOPHAGUS W SINGLE CM (SOL OR THIN BA)  Result Date: 08/29/2020 CLINICAL DATA:  Dysphagia and globus sensation. EXAM: ESOPHOGRAM/BARIUM SWALLOW TECHNIQUE: Single contrast examination was performed using thin density barium. FLUOROSCOPY TIME:  Fluoroscopy Time:  2 minutes and 54 seconds Radiation Exposure Index (if provided by the fluoroscopic device): 36.4 mGy Number of Acquired Spot Images: 0 COMPARISON:  None. FINDINGS: Initial barium swallows demonstrate normal pharyngeal motion with swallowing. No laryngeal penetration or aspiration. No upper esophageal webs, strictures or diverticuli. Esophageal dysmotility with disruption of the  primary peristaltic wave, tertiary contractions, intermittent esophageal spasm and mild to moderate esophageal stasis. Small sliding-type hiatal hernia is noted. No GE reflux was demonstrated. No esophageal mass. There is mild smooth narrowing near the GE junction. The 13 mm barium pill lodged in this area but did eventually pass into the stomach. IMPRESSION: 1. Esophageal dysmotility. 2. Small sliding-type hiatal hernia but no demonstrable GE reflux. 3. Mild smooth narrowing near the GE junction. The 13 mm barium pill lodged in this area but did eventually pass. Electronically Signed   By: Rudie Meyer M.D.   On: 08/29/2020 14:40    (Echo, Carotid, EGD, Colonoscopy, ERCP)    Subjective:  Patient is resting in bed daughter by the bedside anxious to go home she does not want any procedures done Discharge Exam: Vitals:   08/30/20 2108 08/31/20 0422  BP: (!) 153/66 111/86  Pulse: 64 62  Resp: 18 20  Temp: 98.8 F (37.1 C) 98.6 F (37 C)  SpO2: 100% 99%   Vitals:   08/30/20 1428 08/30/20 2108 08/31/20 0422 08/31/20 0600  BP: (!) 149/82 (!) 153/66 111/86   Pulse: 68 64 62   Resp: Temp: 97.9 F (36.6 C) 98.8 F (37.1 C) 98.6 F (37 C)   TempSrc: Oral Oral Oral   SpO2: 100% 100% 99%   Weight:    59.1 kg  Height:        General: Pt is alert, awake, not in acute distress Cardiovascular: RRR, S1/S2 +, no rubs, no gallops Respiratory: Coarse breath sounds bilaterally  bilaterally, no wheezing, no rhonchi Abdominal: Soft, NT, ND, bowel sounds + Extremities: no edema, no cyanosis    The results of significant diagnostics from this hospitalization (including imaging, microbiology, ancillary and laboratory) are listed below for reference.     Microbiology: Recent Results (from the past 240 hour(s))  SARS CORONAVIRUS 2 (TAT 6-24 HRS) Nasopharyngeal Nasopharyngeal Swab     Status: None   Collection Time: 08/28/20  1:53 PM   Specimen: Nasopharyngeal Swab  Result Value Ref  Range Status   SARS Coronavirus 2 NEGATIVE NEGATIVE Final    Comment: (NOTE) SARS-CoV-2 target nucleic acids are NOT DETECTED.  The SARS-CoV-2 RNA is generally detectable in upper and lower respiratory specimens during the acute phase of infection. Negative results do not preclude SARS-CoV-2 infection, do not rule out co-infections with other pathogens, and should not be used as the sole basis for treatment or other patient management decisions. Negative results must be combined with clinical observations, patient history, and epidemiological information. The expected result is Negative.  Fact Sheet for Patients: HairSlick.no  Fact Sheet for Healthcare Providers: quierodirigir.com  This test is not yet approved or cleared by the [place] FDA and  has been authorized for detection and/or diagnosis of SARS-CoV-2 by FDA under an Emergency Use Authorization (EUA). This EUA will remain  in effect (meaning this test can be used) for the duration of the COVID-19 declaration under Se ction 564(b)(1) of the Act, 21 U.S.C. section 360bbb-3(b)(1), unless the authorization is terminated or revoked sooner.  Performed at [facility], [address], [place], [place] [address]      Labs: BNP (last 3 results) Recent Labs    08/28/20 1212  BNP 1,169.0*   Basic Metabolic Panel: Recent Labs  Lab 08/28/20 1212 08/29/20 0511 08/30/20 0453 08/31/20 0520  NA 144 142 140 138  K 4.0 4.8 3.8 4.1  CL 106 106 104 101  CO2 GLUCOSE 167* 106* 143* 115*  BUN 55* 53* 53* 53*  CREATININE 1.50* 1.47* 1.53* 1.55*  CALCIUM 9.1 8.8* 8.5* 8.8*  MG  --   --  2.1  --    Liver Function Tests: Recent Labs  Lab 08/28/20 1212  AST 29  ALT 21  ALKPHOS 44  BILITOT 0.6  PROT 6.2*  ALBUMIN 3.5   No results for input(s): LIPASE, AMYLASE in the last 168 hours. No results for input(s): AMMONIA in the last 168  hours. CBC: Recent Labs  Lab 08/28/20 1212 08/30/20 0453  WBC 9.0 9.5  NEUTROABS  --  7.0  HGB 12.9 10.9*  HCT 40.4 35.4*  MCV 98.5 101.7*  PLT 140* 104*   Cardiac Enzymes: No results for input(s): CKTOTAL, CKMB, CKMBINDEX, TROPONINI in the last 168 hours. BNP: Invalid input(s): POCBNP CBG: Recent Labs  Lab 08/30/20 1729 08/30/20 2046 08/30/20 2346 08/31/20 0419 08/31/20 0812  GLUCAP 232* 269* 172* 107* 97   D-Dimer No results for input(s): DDIMER in the last 72 hours. Hgb A1c Recent Labs    08/29/20 0511  HGBA1C 8.9*   Lipid Profile No results for input(s): CHOL, HDL, LDLCALC, TRIG, CHOLHDL, LDLDIRECT in the last 72 hours. Thyroid function studies No results for input(s): TSH, T4TOTAL, T3FREE, THYROIDAB in the last 72 hours.  Invalid input(s): FREET3 Anemia work up No results for input(s): VITAMINB12, FOLATE, FERRITIN, TIBC, IRON, RETICCTPCT in the last 72 hours. Urinalysis No results found for: COLORURINE, APPEARANCEUR, LABSPEC, PHURINE, GLUCOSEU, HGBUR, BILIRUBINUR, KETONESUR, PROTEINUR, UROBILINOGEN, NITRITE,  LEUKOCYTESUR Sepsis Labs Invalid input(s): PROCALCITONIN,  WBC,  LACTICIDVEN Microbiology Recent Results (from the past 240 hour(s))  SARS CORONAVIRUS 2 (TAT 6-24 HRS) Nasopharyngeal Nasopharyngeal Swab     Status: None   Collection Time: 08/28/20  1:53 PM   Specimen: Nasopharyngeal Swab  Result Value Ref Range Status   SARS Coronavirus 2 NEGATIVE NEGATIVE Final    Comment: (NOTE) SARS-CoV-2 target nucleic acids are NOT DETECTED.  The SARS-CoV-2 RNA is generally detectable in upper and lower respiratory specimens during the acute phase of infection. Negative results do not preclude SARS-CoV-2 infection, do not rule out co-infections with other pathogens, and should not be used as the sole basis for treatment or other patient management decisions. Negative results must be combined with clinical observations, patient history, and epidemiological  information. The expected result is Negative.  Fact Sheet for Patients: HairSlick.nohttps://www.fda.gov/media/138098/download  Fact Sheet for Healthcare Providers: quierodirigir.comhttps://www.fda.gov/media/138095/download  This test is not yet approved or cleared by the [place] FDA and  has been authorized for detection and/or diagnosis of SARS-CoV-2 by FDA under an Emergency Use Authorization (EUA). This EUA will remain  in effect (meaning this test can be used) for the duration of the COVID-19 declaration under Se ction 564(b)(1) of the Act, 21 U.S.C. section 360bbb-3(b)(1), unless the authorization is terminated or revoked sooner.  Performed at [facility], [address] [place], [place] [address]      Time coordinating discharge:  39 minutes  SIGNED:   Alwyn RenElizabeth G Shaniquia Brafford, MD  [facility] 08/31/2020, 12:04 PM

## 2020-08-31 NOTE — Plan of Care (Signed)

## 2020-08-31 NOTE — Progress Notes (Signed)
Palliative Care consult received on admission for "non-compliance issues"-chart reviewed for needs. Based on patient and family goals and medical improvement, inpatient palliative care will sign off and recommend community based palliative care referral for serious illness support and advance care planning. If she plans on staying in this area would recommend referral to heart failure program.   Anderson Malta, DO Palliative Medicine

## 2020-08-31 NOTE — TOC Transition Note (Signed)
Transition of Care [facility]) - CM/SW Discharge Note   Patient Details  Name: Shelia Ward MRN: [id number] Date of Birth: 1941-04-30  Transition of [facility]) CM/SW Contact:  Lanier Clam, RN Phone Number: 08/31/2020, 4:11 PM   Clinical Narrative:  Refer to prior notes-[employer]. Rep David-states delivered home 02 earlier today, & has signature for record. Spoke to nurse of patient-no home 02 in rm, &  tank empty. [employer] will deliver another tank but will take 2hrs. Nurse updated.No further CM needs.    Final next level of care: Home w Home Health Services Barriers to Discharge: No Barriers Identified   Patient Goals and CMS Choice Patient states their goals for this hospitalization and ongoing recovery are:: go home CMS Medicare.gov Compare Post Acute Care list provided to:: Patient Represenative (must comment) Choice offered to / list presented to : Patient  Discharge Placement                       Discharge Plan and Services   Discharge Planning Services: CM Consult Post Acute Care Choice: Home Health (home 02;[facility])          DME Arranged: [employer] DME Agency: Other - Comment [facility]) Date DME Agency Contacted: 08/31/20 Time DME Agency Contacted: [phone] Representative spoke with at DME Agency: Welton Flakes Arranged: RN,PT HH Agency: Other - See comment Rolene Arbour) Date HH Agency Contacted: 08/30/20 Time HH Agency Contacted: 1610 Representative spoke with at [facility] Agency: Training and development officer  Social Determinants of Health (SDOH) Interventions     Readmission Risk Interventions No flowsheet data found.

## 2020-08-31 NOTE — Progress Notes (Signed)
[facility] Gastroenterology Progress Note  Shelia Ward 79 y.o. 05-15-1941  CC:  Dysphagia  Subjective: Patient states her swallowing is "better."  She had grits and coffee for breakfast.  She refuses EGD, but would not discuss reasoning.  States she just does not want the procedure and wants to go home.  Denies any abdominal pain, nausea, or vomiting.  ROS : Review of Systems  Cardiovascular: Negative for chest pain and palpitations.  Gastrointestinal: Positive for heartburn. Negative for abdominal pain, blood in stool, constipation, diarrhea, melena, nausea and vomiting.    Objective: Vital signs in last 24 hours: Vitals:   08/30/20 2108 08/31/20 0422  BP: (!) 153/66 111/86  Pulse: 64 62  Resp: 18 20  Temp: 98.8 F (37.1 C) 98.6 F (37 C)  SpO2: 100% 99%    Physical Exam:  General:  Alert, cooperative, no distress, appears stated age; daughter at bedside  Head:  Normocephalic, without obvious abnormality, atraumatic  Eyes:  Anicteric sclera, EOMs intact  Lungs:   Respirations unlabored  Heart:  Regular rate and rhythm  Abdomen:   Soft, non-tender, non-distended, no peritoneal signs   Extremities: + mild bilateral lower extremity edema  Pulses: 2+ and symmetric    Lab Results: Recent Labs    08/30/20 0453 08/31/20 0520  NA 140 138  K 3.8 4.1  CL 104 101  CO2 27 29  GLUCOSE 143* 115*  BUN 53* 53*  CREATININE 1.53* 1.55*  CALCIUM 8.5* 8.8*  MG 2.1  --    Recent Labs    08/28/20 1212  AST 29  ALT 21  ALKPHOS 44  BILITOT 0.6  PROT 6.2*  ALBUMIN 3.5   Recent Labs    08/28/20 1212 08/30/20 0453  WBC 9.0 9.5  NEUTROABS  --  7.0  HGB 12.9 10.9*  HCT 40.4 35.4*  MCV 98.5 101.7*  PLT 140* 104*   No results for input(s): LABPROT, INR in the last 72 hours.   Assessment: Dysphagia:  Barium swallow 08/29/20: Esophageal dysmotility.  Small sliding-type hiatal hernia but no demonstrable GE reflux. Mild smooth narrowing near the GE junction. The 13 mm barium  pill lodged in this area but did eventually pass.  Acute on chronic heart failure, EF 20-25% as of 12/28 echo  Pericardial effusion on 12/28 echo, no evidence of tamponade  CAD, last dose Plavix 12/28 at 11:00am  CKD  Plan: Re-discussed EGD with patient, informing her that the hospital setting would be the best place for EGD with dilation, taking into account her severe CHF, as well as the fact that she's been off Plavix since 12/28.  She declined EGD.  She lives in [place], and I have advised her to see a GI provider in her area if symptoms persist or worsen.  Daughter is at bedside and states patient has been non-compliant with outpatient visits.   Diet as per speech recommendations.  Continue Protonix to 40 mg BID.  Resume Plavix, as no procedures are planned.  [facility] will sign off.  Please contact us if we can be of any further assistance during this hospital stay.  Edrick Kins PA-C 08/31/2020, 11:34 AM  Contact #  [phone]

## 2020-08-31 NOTE — TOC Progression Note (Signed)
Transition of Care [facility]) - Progression Note    Patient Details  Name: Shelia Ward MRN: [id number] Date of Birth: 12-Feb-1941  Transition of Care [facility]) CM/SW Contact  Yahmir Sokolov, Olegario Messier, RN Phone Number: 08/31/2020, 11:08 AM  Clinical Narrative: Has home 02-Active w/[employer]-they will bring home 02 travel tank to rm prior d/c.Active w/[facility].Family will transport home. No further CM needs.      Expected Discharge Plan: Home w Home Health Services Barriers to Discharge: Continued Medical Work up  Expected Discharge Plan and Services Expected Discharge Plan: Home w Home Health Services   Discharge Planning Services: CM Consult Post Acute Care Choice: Home Health (home [phone];Active [facility]) Living arrangements for the past 2 months: Single Family Home                                       Social Determinants of Health (SDOH) Interventions    Readmission Risk Interventions No flowsheet data found.

## 2020-08-31 NOTE — Progress Notes (Addendum)
Progress Note  Patient Name: Shelia Ward Date of Encounter: 08/31/2020  [facility] Cardiologist: BM cardiology  Subjective   Very low urine out put per I/Os. Weights slightly down. Creatinine stable. No signs of tamponade. Her daughter is at bedside. On exam volume status improving.   Inpatient Medications    Scheduled Meds: . aspirin EC  81 mg Oral Daily  . atorvastatin  20 mg Oral QHS  . furosemide  80 mg Intravenous BID  . heparin  5,000 Units Subcutaneous Q8H  . insulin aspart  0-9 Units Subcutaneous Q4H  . levothyroxine  88 mcg Oral Q0600  . metoprolol succinate  50 mg Oral QPM  . pantoprazole  40 mg Oral BID AC  . sodium chloride flush  3 mL Intravenous Q12H   Continuous Infusions: . sodium chloride    . dextrose Stopped (08/30/20 1735)   PRN Meds: sodium chloride, acetaminophen, ondansetron (ZOFRAN) IV, sodium chloride flush   Vital Signs    Vitals:   08/30/20 1428 08/30/20 2108 08/31/20 0422 08/31/20 0600  BP: (!) 149/82 (!) 153/66 111/86   Pulse: 68 64 62   Resp: Temp: 97.9 F (36.6 C) 98.8 F (37.1 C) 98.6 F (37 C)   TempSrc: Oral Oral Oral   SpO2: 100% 100% 99%   Weight:    59.1 kg  Height:        Intake/Output Summary (Last 24 hours) at 08/31/2020 0851 Last data filed at 08/30/2020 1733 Gross per 24 hour  Intake 717 ml  Output 100 ml  Net 617 ml   Last 3 Weights 08/31/2020 08/30/2020 08/29/2020  Weight (lbs) 130 lb 4.7 oz 131 lb 13.4 oz 132 lb 6.4 oz  Weight (kg) 59.1 kg 59.8 kg 60.056 kg      Telemetry    SR. PVCs, HR 60s - Personally Reviewed  ECG    ordered - Personally Reviewed  Physical Exam   GEN: No acute distress.   Neck: + JVD Cardiac: RRR, no murmurs, rubs, or gallops.  Respiratory: diffusely diminished, +crackles. GI: Soft, nontender, non-distended  MS: 1+ b/l edema; No deformity. Neuro:  Nonfocal  Psych: Normal affect   Labs    High Sensitivity Troponin:  No results for input(s): TROPONINIHS  in the last 720 hours.    Chemistry Recent Labs  Lab 08/28/20 1212 08/29/20 0511 08/30/20 0453 08/31/20 0520  NA 144 142 140 138  K 4.0 4.8 3.8 4.1  CL 106 106 104 101  CO2 GLUCOSE 167* 106* 143* 115*  BUN 55* 53* 53* 53*  CREATININE 1.50* 1.47* 1.53* 1.55*  CALCIUM 9.1 8.8* 8.5* 8.8*  PROT 6.2*  --   --   --   ALBUMIN 3.5  --   --   --   AST 29  --   --   --   ALT 21  --   --   --   ALKPHOS 44  --   --   --   BILITOT 0.6  --   --   --   GFRNONAA 35* 36* 34* 33*  ANIONGAP Hematology Recent Labs  Lab 08/28/20 1212 08/30/20 0453  WBC 9.0 9.5  RBC 4.10 3.48*  HGB 12.9 10.9*  HCT 40.4 35.4*  MCV 98.5 101.7*  MCH 31.5 31.3  MCHC 31.9 30.8  RDW 14.0 14.2  PLT 140* 104*    BNP Recent Labs  Lab  08/28/20 1212  BNP 1,169.0*     DDimer No results for input(s): DDIMER in the last 168 hours.   Radiology    ECHOCARDIOGRAM COMPLETE  Result Date: 08/29/2020    ECHOCARDIOGRAM REPORT   Patient Name:   Daralene MilchSANDRA Mizer Date of Exam: 08/29/2020 Medical Rec #:  [id number]     Height:       61.0 in Accession #:    [id number]    Weight:       132.4 lb Date of Birth:  09/05/1940      BSA:          1.585 m Patient Age:    79 years      BP:           144/54 mmHg Patient Gender: F             HR:           69 bpm. Exam Location:  Inpatient Procedure: 2D Echo and Intracardiac Opacification Agent Indications:    CHF  History:        Patient has no prior history of Echocardiogram examinations.                 Prior CABG; Risk Factors:Hypertension and Diabetes.  Sonographer:    Thurman Coyerasey Kirkpatrick RDCS (AE) Referring Phys: [id number] [facility] DivRANAV M PATEL IMPRESSIONS  1. No left ventricular thrombus is seen with Definity contrast. Left ventricular ejection fraction, by estimation, is 20 to 25%. The left ventricle has severely decreased function. The left ventricle demonstrates global hypokinesis. The left ventricular  internal cavity size was moderately dilated. Left ventricular  diastolic parameters are consistent with Grade II diastolic dysfunction (pseudonormalization). Elevated left atrial pressure. There is mild dyskinesis of the left ventricular, entire apical segment.  2. Right ventricular systolic function is mildly reduced. The right ventricular size is normal. Mildly increased right ventricular wall thickness. There is moderately elevated pulmonary artery systolic pressure. The estimated right ventricular systolic pressure is 59.6 mmHg.  3. Left atrial size was mild to moderately dilated.  4. Moderate pericardial effusion. The pericardial effusion is circumferential. There is no evidence of cardiac tamponade.  5. The mitral valve is normal in structure. Mild mitral valve regurgitation. Moderate to severe mitral annular calcification.  6. Tricuspid valve regurgitation is moderate to severe.  7. The aortic valve is tricuspid. Aortic valve regurgitation is trivial. Mild to moderate aortic valve sclerosis/calcification is present, without any evidence of aortic stenosis.  8. The inferior vena cava is dilated in size with <50% respiratory variability, suggesting right atrial pressure of 15 mmHg. Comparison(s): Prior images unable to be directly viewed, comparison made by report only. Findings are similar to 11/09/2019 echo report in [facility]. FINDINGS  Left Ventricle: No left ventricular thrombus is seen with Definity contrast. Left ventricular ejection fraction, by estimation, is 20 to 25%. The left ventricle has severely decreased function. The left ventricle demonstrates global hypokinesis. Mild dyskinesis of the left ventricular, entire apical segment. Definity contrast agent was given IV to delineate the left ventricular endocardial borders. The left ventricular internal cavity size was moderately dilated. There is no left ventricular hypertrophy. Abnormal (paradoxical) septal motion, consistent with left bundle branch block. Left ventricular diastolic parameters are  consistent with Grade II diastolic dysfunction (pseudonormalization). Elevated left atrial pressure. Right Ventricle: The right ventricular size is normal. Mildly increased right ventricular wall thickness. Right ventricular systolic function is mildly reduced. There is moderately elevated pulmonary artery systolic pressure. The tricuspid regurgitant velocity is 3.34  m/s, and with an assumed right atrial pressure of 15 mmHg, the estimated right ventricular systolic pressure is 59.6 mmHg. Left Atrium: Left atrial size was mild to moderately dilated. Right Atrium: Right atrial size was normal in size. Pericardium: A moderately sized pericardial effusion is present. The pericardial effusion is circumferential. There is no evidence of cardiac tamponade. Mitral Valve: The mitral valve is normal in structure. Moderate to severe mitral annular calcification. Mild mitral valve regurgitation, with centrally-directed jet. MV peak gradient, 10.8 mmHg. The mean mitral valve gradient is 3.0 mmHg. Tricuspid Valve: The tricuspid valve is normal in structure. Tricuspid valve regurgitation is moderate to severe. Aortic Valve: The aortic valve is tricuspid. Aortic valve regurgitation is trivial. Mild to moderate aortic valve sclerosis/calcification is present, without any evidence of aortic stenosis. Aortic valve mean gradient measures 7.6 mmHg. Aortic valve peak  gradient measures 14.5 mmHg. Aortic valve area, by VTI measures 1.50 cm. Pulmonic Valve: The pulmonic valve was grossly normal. Pulmonic valve regurgitation is mild. Aorta: The aortic root is normal in size and structure. Venous: The inferior vena cava is dilated in size with less than 50% respiratory variability, suggesting right atrial pressure of 15 mmHg. IAS/Shunts: No atrial level shunt detected by color flow Doppler.  LEFT VENTRICLE PLAX 2D LVIDd:         6.40 cm LVIDs:         4.80 cm LV PW:         0.90 cm LV IVS:        0.80 cm LVOT diam:     2.00 cm LV SV:          65 LV SV Index:   41 LVOT Area:     3.14 cm  RIGHT VENTRICLE RV S prime:     7.25 cm/s TAPSE (M-mode): 1.1 cm LEFT ATRIUM             Index       RIGHT ATRIUM           Index LA diam:        4.10 cm 2.59 cm/m  RA Area:     18.60 cm LA Vol (A2C):   68.5 ml 43.22 ml/m RA Volume:   52.50 ml  33.12 ml/m LA Vol (A4C):   65.6 ml 41.39 ml/m LA Biplane Vol: 68.6 ml 43.28 ml/m  AORTIC VALVE AV Area (Vmax):    1.62 cm AV Area (Vmean):   1.50 cm AV Area (VTI):     1.50 cm AV Vmax:           190.25 cm/s AV Vmean:          129.393 cm/s AV VTI:            0.433 m AV Peak Grad:      14.5 mmHg AV Mean Grad:      7.6 mmHg LVOT Vmax:         98.40 cm/s LVOT Vmean:        61.900 cm/s LVOT VTI:          0.206 m LVOT/AV VTI ratio: 0.48  AORTA Ao Root diam: 2.60 cm MITRAL VALVE                TRICUSPID VALVE MV Area (PHT): 4.06 cm     TR Peak grad:   44.6 mmHg MV Peak grad:  10.8 mmHg    TR Vmax:        334.00 cm/s MV Mean grad:  3.0  mmHg MV Vmax:       1.64 m/s     SHUNTS MV Vmean:      80.6 cm/s    Systemic VTI:  0.21 m MV Decel Time: 187 msec     Systemic Diam: 2.00 cm MV E velocity: 157.00 cm/s MV A velocity: 110.00 cm/s MV E/A ratio:  1.43 Mihai Croitoru MD Electronically signed by Thurmon Fair MD Signature Date/Time: 08/29/2020/12:09:40 PM    Final    DG ESOPHAGUS W SINGLE CM (SOL OR THIN BA)  Result Date: 08/29/2020 CLINICAL DATA:  Dysphagia and globus sensation. EXAM: ESOPHOGRAM/BARIUM SWALLOW TECHNIQUE: Single contrast examination was performed using thin density barium. FLUOROSCOPY TIME:  Fluoroscopy Time:  2 minutes and 54 seconds Radiation Exposure Index (if provided by the fluoroscopic device): 36.4 mGy Number of Acquired Spot Images: 0 COMPARISON:  None. FINDINGS: Initial barium swallows demonstrate normal pharyngeal motion with swallowing. No laryngeal penetration or aspiration. No upper esophageal webs, strictures or diverticuli. Esophageal dysmotility with disruption of the primary peristaltic wave,  tertiary contractions, intermittent esophageal spasm and mild to moderate esophageal stasis. Small sliding-type hiatal hernia is noted. No GE reflux was demonstrated. No esophageal mass. There is mild smooth narrowing near the GE junction. The 13 mm barium pill lodged in this area but did eventually pass into the stomach. IMPRESSION: 1. Esophageal dysmotility. 2. Small sliding-type hiatal hernia but no demonstrable GE reflux. 3. Mild smooth narrowing near the GE junction. The 13 mm barium pill lodged in this area but did eventually pass. Electronically Signed   By: Rudie Meyer M.D.   On: 08/29/2020 14:40    Cardiac Studies   Echo 08/29/20 1. No left ventricular thrombus is seen with Definity contrast. Left  ventricular ejection fraction, by estimation, is 20 to 25%. The left  ventricle has severely decreased function. The left ventricle demonstrates  global hypokinesis. The left ventricular  internal cavity size was moderately dilated. Left ventricular diastolic  parameters are consistent with Grade II diastolic dysfunction  (pseudonormalization). Elevated left atrial pressure. There is mild  dyskinesis of the left ventricular, entire apical  segment.  2. Right ventricular systolic function is mildly reduced. The right  ventricular size is normal. Mildly increased right ventricular wall  thickness. There is moderately elevated pulmonary artery systolic  pressure. The estimated right ventricular systolic  pressure is 59.6 mmHg.  3. Left atrial size was mild to moderately dilated.  4. Moderate pericardial effusion. The pericardial effusion is  circumferential. There is no evidence of cardiac tamponade.  5. The mitral valve is normal in structure. Mild mitral valve  regurgitation. Moderate to severe mitral annular calcification.  6. Tricuspid valve regurgitation is moderate to severe.  7. The aortic valve is tricuspid. Aortic valve regurgitation is trivial.  Mild to moderate aortic  valve sclerosis/calcification is present, without  any evidence of aortic stenosis.  8. The inferior vena cava is dilated in size with <50% respiratory  variability, suggesting right atrial pressure of 15 mmHg.   Comparison(s): Prior images unable to be directly viewed, comparison made  by report only. Findings are similar to 11/09/2019 echo report in  [facility].   Echo 11/2019 Left Ventricle: Normal left ventricle size. Systolic function is  moderate to severely abnormal. EF: 25-30%. Severe anteroseptal  hypokinesis. Doppler parameters consistent with mild diastolic  dysfunction. Markedly elevated E/e` ~ 42, suggesting increased LV filling  pressures.  . Left Atrium: Left atrium cavity is mildly dilated.  . Right Atrium: Right atrium is mildly dilated.  Marland Kitchen  Aortic Valve: There is probably mild AS.  Marland Kitchen Mitral Valve: There is mild regurgitation.  . Tricuspid Valve: There is moderate regurgitation. The right ventricular  systolic pressure is mod to severely elevated  . Pericardium: There is a small circumferential pericardial effusion  noted anterior and posterior to the heart. There is a left pleural  effusion. There is no echocardiographic evidence of tamponade.  Echo 04/22/19 Interpretation Summary  The left ventricle is mildly dilated.  Ejection Fraction = 15%.  Left ventricular systolic function is severely reduced.  Grade I diastolic dysfunction, (abnormal relaxation pattern).  The left atrium is mildly dilated.  There is mild mitral regurgitation.  Right ventricular systolic pressure is 37 mmhg.  There is mild pulmonary hypertension.  There is mild aortic sclerosis.;  No hemodynamically significant valvular aortic stenosis.  Trace to mild aortic regurgitation.  Left Ventricle  The left ventricle is mildly dilated. Ejection Fraction = 15%. Left  ventricular systolic function is severely reduced. Grade I diastolic  dysfunction, (abnormal relaxation pattern).  There is severe global  hypokinesis of the left ventricle.   Echo 12/2015 Conclusions  Summary  The examination was technically difficult with suboptimal acoustic  windows.  Estimated LVEF 55-60%.  LV diastolic dysfunction.  Mild left atrial enlargement.  Mild pulmonary hypertension.  Hyperechoic and mildly thickened pericardium " LHC 04/27/19 (Dr. Wynona Neat) Conclusions:  1. Severe 3 vessel obstructive coronary artery disease. 2. Status post 2 vessel CABG with patent bypass grafts but with obstructive disease of the native circumflex and also of the 1st marginal branch artery after insertion of the graft. 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 3. Dilated cardiomyopathy with severe left ventricular systolic dysfunction. LV dysfunction is out of proportion to the degree of her CAD, consider a nonischemic cardiomyopathy such as takotsubo cardiomyopathy given recent emotional stressors with the loss of her husband. 4. Successful drug-eluting stent placement to the 1st marginal branch through the SVG"   Patient Profile     79 y.o. 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 Ashley Royalty.  Assessment & Plan    Acute on chronic systolic and diastolic heart failure/ischemic cardiomyopathy with most recent EF 25 to 30% - Patient has a history of noncompliance with meds and diet - In the ER BNP was elevated.  Chest x-ray suggestive of CHF.   - PTA lasix 40 mg daily. She did not improve with increased lasix dose as OP -Patient was started on IV Lasix 40 daily>>increased to 80 mg BID -Echo here showed EF 20 to 25%, global hypokinesis, moderate dilation of the  LV, grade 2 diastolic dysfunction, elevated left atrial pressure, mild dyskinesis of the LV, mildly reduced RV function, moderately elevated pulmonary artery systolic pressure, mild to moderate dilation of the left atrium, moderate pericardial effusion no evidence of tamponade, mild MR, moderate to severe TR -Creatinine stable, 1.47> 1.53>1.55. Baseline creatinine around ?1.3 - I/Os show very low urine output ( ). Unsure if this is accurate. Weights continue to trend down.  - Still has LLE and crackles on exam. Continue with diuresis  Pericardial effusion - Patient's son reported prior pericardial effusions in the past (3) requiring pericardiocentesis -Pericardial effusion on echo  08/29/2020 which showed moderate pericardial effusion with no evidence of tamponade -continue to monitor for signs of tamponade - Sed rate normal and CRP mildly elevated. - consider repeat echo couple days to assess effusion.   CAD s/p CABGx 2 in 2014 with subsequent stenting to 1st Marginal branch -Patient denies chest pain -Continue aspirin, atorvastatin 20 mg daily, Toprol-XL 50 mg daily -Home med Plavix was held due to dysphagia for possible intervention  Hypertension -PTA Toprol 50 daily, losartan 25 mg twice daily -ARB held for CKD -IV Lasix  Hyperlipidemia -Continue statin -LDL 57 in December 2021  CKD stage III -creatinine stable - Monitor creatinine with diuresis  Diabetes type 2 -SSI per primary team  Dysphagia/esophageal narrowing -Imaging showed esophageal narrowing.  She is having trouble with dysphagia -GI consulted and Plavix was held in anticipation of possible intervention. GI saw and plan on possible dilation.   Noncompliance - complicated situation since patient lives alone and is not accepting help   For questions or updates, please contact [facility] Please consult [email or web address] for contact info under        Signed, Christine Morton David Stall, PA-C  08/31/2020, 8:51  AM

## 2020-08-31 NOTE — TOC Progression Note (Signed)
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      Expected Discharge Plan: Home w Home Health Services Barriers to Discharge: Continued Medical Work up  Expected Discharge Plan and Services Expected Discharge Plan: Home w Home Health Services   Discharge Planning Services: CM Consult Post Acute Care Choice: Home Health (home [phone];Active [facility]) Living arrangements for the past 2 months: Single Family Home Expected Discharge Date: 08/31/20                                     Social Determinants of Health (SDOH) Interventions    Readmission Risk Interventions No flowsheet data found.

## 2020-08-31 NOTE — Progress Notes (Signed)
Physical Therapy Treatment Patient Details Name: Shelia Ward MRN: [id number] DOB: 1940/10/20 Today's Date: 08/31/2020    History of Present Illness Patient is an 79 year old female PMH of HTN, CKD 3B, type II DM, chronic systolic CHF, CAD, presenting to ED with LE swelling and SOB. Dx: acute on chronic CHF    PT Comments    Patient making steady progress with acute PT and increased gait distance today. Pt noted to desat on RA with gait to low of 83%. Saturations improved to 98% with 2L/min during gait. Patient instructed on functional LE strengthening for LE's and performed repeated sit<>stands. Patient's daughter present and educated on benefits of this exercise to perform with her mother as able once pt returns home. Recommending HHPT and 24/7 assist/supervision. Will need to maximize [facility] services as pt does not want to rehab at SNF setting and live alone. Acute PT will continue to progress as able.    Follow Up Recommendations  Home health PT;Supervision/Assistance - 24 hour     Equipment Recommendations  None recommended by PT    Recommendations for Other Services       Precautions / Restrictions Precautions Precautions: Fall Precaution Comments: monitor O2, hx sliding OOB/ falling foward when asleep Restrictions Weight Bearing Restrictions: No    Mobility  Bed Mobility Overal bed mobility: Needs Assistance Bed Mobility: Supine to Sit     Supine to sit: Min guard;HOB elevated     General bed mobility comments: guarding for safety and cues to use bed rail. pt able to scoot forward at EOB to place feet on floor with no assist.  Transfers Overall transfer level: Needs assistance Equipment used: Rolling walker (2 wheeled) Transfers: Sit to/from Stand Sit to Stand: Min assist         General transfer comment: assist for power up and to steady. cues for posture once standing.  Ambulation/Gait Ambulation/Gait assistance: Min assist Gait Distance (Feet): 170  Feet Assistive device: Rolling walker (2 wheeled) Gait Pattern/deviations: Step-through pattern;Decreased stride length;Trunk flexed Gait velocity: decr   General Gait Details: Cues for posture intermittently and min assist to manage walker for turning but pt able to safely use walker for forward gait. Pt desaturaing to 83% on RA with gait, 2L/min increased sats to 98%.   Stairs             Wheelchair Mobility    Modified Rankin (Stroke Patients Only)       Balance Overall balance assessment: Needs assistance Sitting-balance support: Feet supported Sitting balance-Leahy Scale: Fair     Standing balance support: Bilateral upper extremity supported Standing balance-Leahy Scale: Fair                              Cognition Arousal/Alertness: Awake/alert Behavior During Therapy: WFL for tasks assessed/performed Overall Cognitive Status: Within Functional Limits for tasks assessed                           Safety/Judgement: Decreased awareness of safety            Exercises Other Exercises Other Exercises: 10x Sit<>Stand from recliner. hand on knees for power up and min guard/light assist for safety to steady with rising.    General Comments        Pertinent Vitals/Pain Pain Assessment: No/denies pain    Home Living  Prior Function            PT Goals (current goals can now be found in the care plan section) Acute Rehab PT Goals Patient Stated Goal: to go home PT Goal Formulation: With patient Time For Goal Achievement: 09/13/20 Potential to Achieve Goals: Good Progress towards PT goals: Progressing toward goals    Frequency    Min 3X/week      PT Plan Current plan remains appropriate    Co-evaluation              AM-PAC PT "6 Clicks" Mobility   Outcome Measure  Help needed turning from your back to your side while in a flat bed without using bedrails?: A Little Help needed moving  from lying on your back to sitting on the side of a flat bed without using bedrails?: A Little Help needed moving to and from a bed to a chair (including a wheelchair)?: A Little Help needed standing up from a chair using your arms (e.g., wheelchair or bedside chair)?: A Little Help needed to walk in hospital room?: A Little Help needed climbing 3-5 steps with a railing? : A Little 6 Click Score: 18    End of Session Equipment Utilized During Treatment: Gait belt;Oxygen Activity Tolerance: Patient tolerated treatment well Patient left: in chair;with call bell/phone within reach;with family/visitor present;with chair alarm set Nurse Communication: Mobility status PT Visit Diagnosis: Unsteadiness on feet (R26.81);Muscle weakness (generalized) (M62.81);History of falling (Z91.81)     Time: 2423-5361 PT Time Calculation (min) (ACUTE ONLY): 27 min  Charges:  $Gait Training: 8-22 mins $Therapeutic Exercise: 8-22 mins                     Wynn Maudlin, DPT Acute Rehabilitation Services Office [phone] Pager [phone]     Anitra Lauth 08/31/2020, 11:28 AM

## 2020-09-01 LAB — BASIC METABOLIC PANEL
Anion gap: 9 (ref 5–15)
BUN: 55 mg/dL — ABNORMAL HIGH (ref 8–23)
CO2: 30 mmol/L (ref 22–32)
Calcium: 9.1 mg/dL (ref 8.9–10.3)
Chloride: 103 mmol/L (ref 98–111)
Creatinine, Ser: 1.51 mg/dL — ABNORMAL HIGH (ref 0.44–1.00)
GFR, Estimated: 35 mL/min — ABNORMAL LOW (ref >60)
Glucose, Bld: 121 mg/dL — ABNORMAL HIGH (ref 70–99)
Potassium: 4 mmol/L (ref 3.5–5.1)
Sodium: 142 mmol/L (ref 135–145)

## 2020-09-01 LAB — GLUCOSE, CAPILLARY
Glucose-Capillary: 157 mg/dL — ABNORMAL HIGH (ref 70–99)
Glucose-Capillary: 124 mg/dL — ABNORMAL HIGH (ref 70–99)
Glucose-Capillary: 116 mg/dL — ABNORMAL HIGH (ref 70–99)

## 2020-09-01 NOTE — Progress Notes (Signed)
08/31/2020- 12n  Upon speaking with primary RN, L.Nelson; and reviewing PT note of pt's oxygen saturation and requirements, Pt sat documentation qualification below:    SATURATION QUALIFICATIONS: (This note is used to comply with regulatory documentation for home oxygen)  Patient Saturations on Room Air at Rest =99%  Patient Saturations on Room Air while Ambulating = 83%  Patient Saturations on 2 Liters of oxygen while Ambulating = 98%  Please briefly explain why patient needs home oxygen: Per PT note, pt desats on RA when ambulatory, but recovers with 2 liters of oxygen.  Sueann Brownley, Yancey Flemings, RN

## 2020-09-01 NOTE — TOC Transition Note (Addendum)
Transition of Care [facility] - CM/SW Discharge Note   Patient Details  Name: Shelia Ward MRN: [id number] Date of Birth: 07-Feb-1941  [facility] CM/SW Contact:  Lanier Clam, RN Phone Number: 09/01/2020, 12:48 PM   Clinical Narrative: After d/c note-Received  Call today from dtr Jacki Cones about oxygen delivery from [facility] in patient's home town area-Laurie gave this Case manager the contact info:Carla rep for [employer] tel#[phone]/fax#[phone]-needed 02 sats,& dx, there were no documented 02 sats to send,upon review, & talking to nurse from yesterday-patient ambulated to chair & 02 sats were 100%. CM informed Albin Felling that we do not have the 02 sats documented-therefore she cannot provide the oxygen for the patient. Per Carla-patient had already had the oxygen picked up from her home-therefore she was not active with oxygen, & was only using it prn. No further CM needs. 1:55p-Faxed to [facility] w/confirmation 02 sats,H&P,d/c summary.Left vm to confirm. TC Laurie-informed of above-voiced understanding.No further CM needs.   Final next level of care: Home w Home Health Services Barriers to Discharge: No Barriers Identified   Patient Goals and CMS Choice Patient states their goals for this hospitalization and ongoing recovery are:: go home CMS Medicare.gov Compare Post Acute Care list provided to:: Patient Represenative (must comment) Choice offered to / list presented to : Patient  Discharge Placement                       Discharge Plan and Services   Discharge Planning Services: CM Consult Post Acute Care Choice: Home Health (home 02;[facility] HHPT)          DME Arranged: Oxygen DME Agency: Other - Comment [facility] [employer]) Date DME Agency Contacted: 08/31/20 Time DME Agency Contacted: [phone] Representative spoke with at DME Agency: Welton Flakes Arranged: RN,PT HH Agency: Other - See comment Rolene Arbour) Date HH Agency Contacted:  08/30/20 Time HH Agency Contacted: [phone] Representative spoke with at [facility] Agency: Training and development officer  Social Determinants of Health (SDOH) Interventions     Readmission Risk Interventions No flowsheet data found.

## 2020-12-31 DEATH — deceased

## 2022-07-23 IMAGING — DX DG CHEST 1V PORT
1 series · 1 of 1 positions shown · non-contrast
Comparison: 08/28/2020 at [DATE] p.m.

CLINICAL DATA: CHF, weakness, short of breath, choking episode
earlier today

EXAM:
PORTABLE CHEST 1 VIEW

[chest ap]
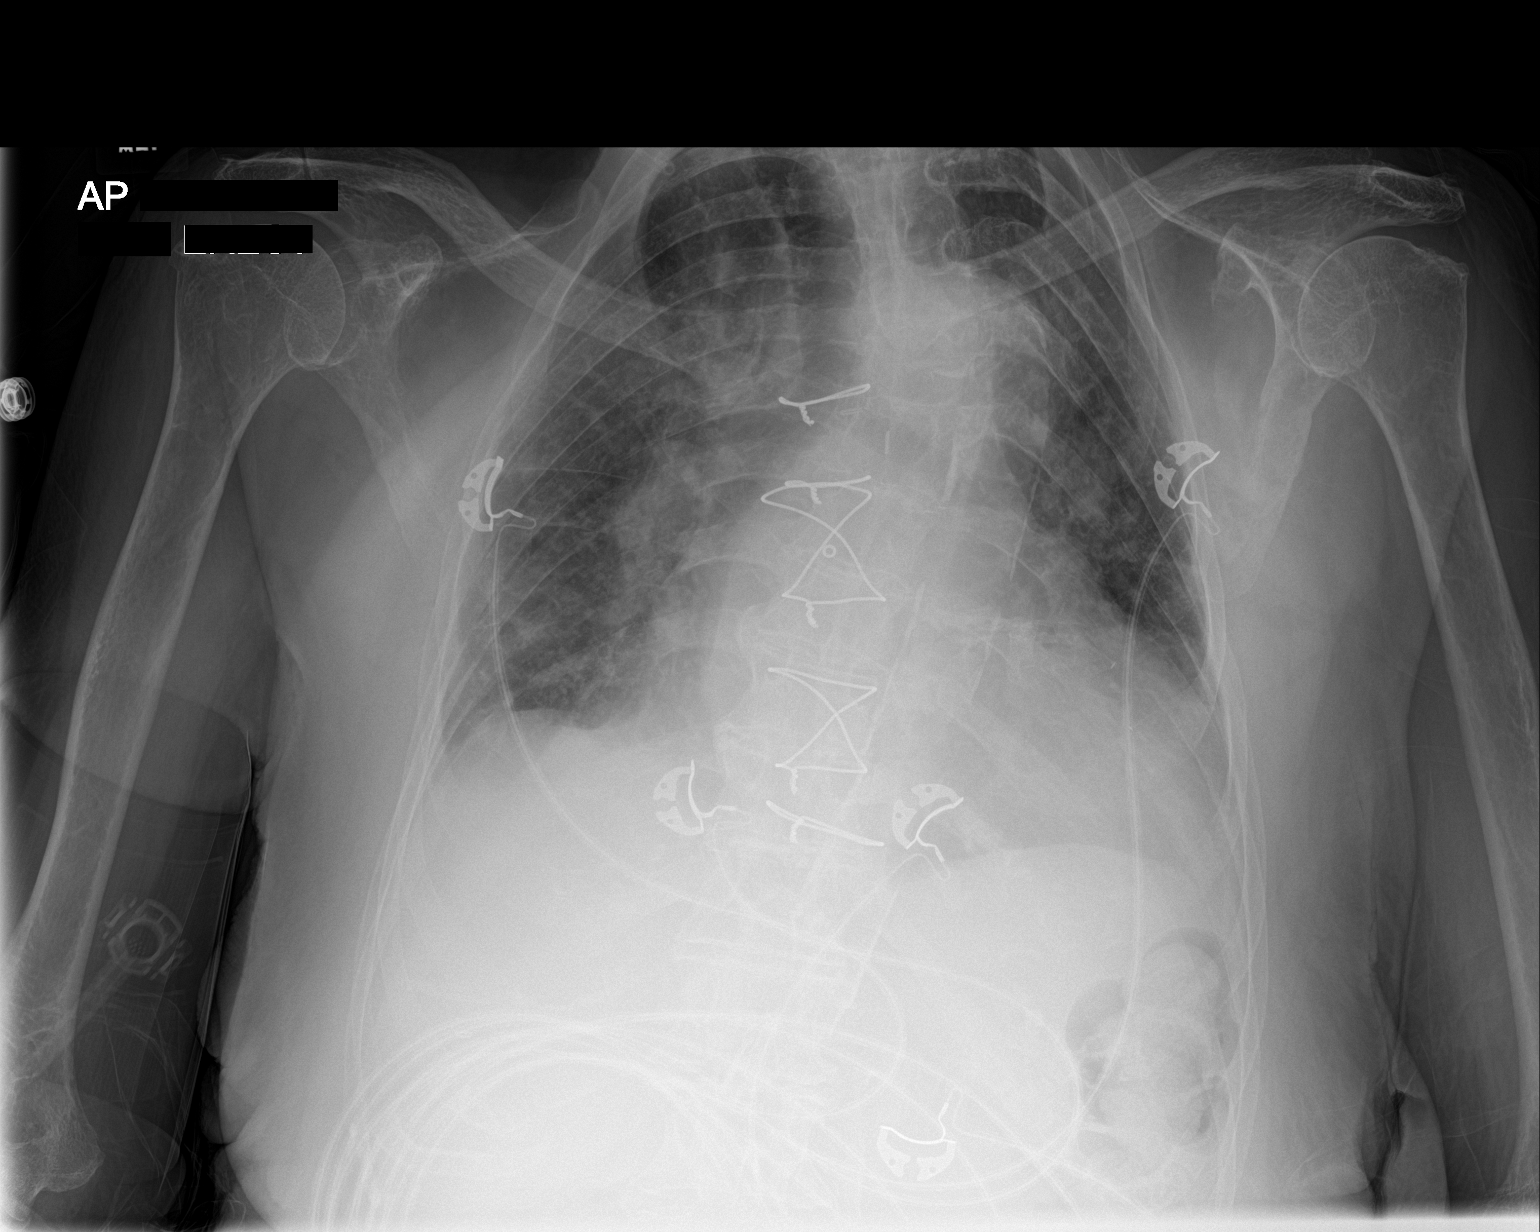

[1 of 1 positions shown; findings below may reference images not displayed]

FINDINGS: Single frontal view of the chest demonstrates persistent enlargement
the cardiac silhouette. Stable atherosclerosis of the aorta.
Postsurgical changes from median sternotomy. Central vascular
congestion and small bilateral pleural effusions are again noted. No
focal consolidation or pneumothorax.
IMPRESSION: 1. Stable findings of mild congestive heart failure.

## 2023-06-03 ENCOUNTER — Other Ambulatory Visit: Payer: Self-pay

## 2024-07-01 ENCOUNTER — Other Ambulatory Visit (HOSPITAL_COMMUNITY): Payer: Self-pay
